# Patient Record
Sex: Female | Born: 1935 | Race: White | Hispanic: No | Smoking: Never smoker
Health system: Southern US, Community
[De-identification: ages and names within clinical notes are randomized; demographics above are authoritative.]

## PROBLEM LIST (undated history)

## (undated) DIAGNOSIS — K219 Gastro-esophageal reflux disease without esophagitis: Secondary | ICD-10-CM

## (undated) DIAGNOSIS — F0390 Unspecified dementia without behavioral disturbance: Secondary | ICD-10-CM

## (undated) DIAGNOSIS — I10 Essential (primary) hypertension: Secondary | ICD-10-CM

## (undated) DIAGNOSIS — F0280 Dementia in other diseases classified elsewhere without behavioral disturbance: Secondary | ICD-10-CM

## (undated) DIAGNOSIS — G309 Alzheimer's disease, unspecified: Secondary | ICD-10-CM

## (undated) DIAGNOSIS — M1990 Unspecified osteoarthritis, unspecified site: Secondary | ICD-10-CM

---

## 2002-05-09 ENCOUNTER — Encounter: Admission: RE | Admit: 2002-05-09 | Discharge: 2002-05-09 | Payer: Self-pay | Admitting: Orthopedic Surgery

## 2002-05-09 ENCOUNTER — Encounter: Payer: Self-pay | Admitting: Orthopedic Surgery

## 2002-05-25 ENCOUNTER — Encounter: Payer: Self-pay | Admitting: Neurosurgery

## 2002-05-25 ENCOUNTER — Inpatient Hospital Stay (HOSPITAL_COMMUNITY): Admission: RE | Admit: 2002-05-25 | Discharge: 2002-05-26 | Payer: Self-pay | Admitting: Neurosurgery

## 2002-07-06 ENCOUNTER — Encounter: Payer: Self-pay | Admitting: Neurosurgery

## 2002-07-06 ENCOUNTER — Encounter: Admission: RE | Admit: 2002-07-06 | Discharge: 2002-07-06 | Payer: Self-pay | Admitting: Neurosurgery

## 2002-10-25 ENCOUNTER — Encounter: Payer: Self-pay | Admitting: Orthopedic Surgery

## 2002-10-30 ENCOUNTER — Encounter: Payer: Self-pay | Admitting: Orthopedic Surgery

## 2002-10-30 ENCOUNTER — Inpatient Hospital Stay (HOSPITAL_COMMUNITY): Admission: RE | Admit: 2002-10-30 | Discharge: 2002-11-01 | Payer: Self-pay | Admitting: Orthopedic Surgery

## 2003-03-12 ENCOUNTER — Encounter: Admission: RE | Admit: 2003-03-12 | Discharge: 2003-03-12 | Payer: Self-pay | Admitting: Internal Medicine

## 2003-03-21 ENCOUNTER — Encounter: Admission: RE | Admit: 2003-03-21 | Discharge: 2003-03-21 | Payer: Self-pay | Admitting: Orthopedic Surgery

## 2003-10-04 ENCOUNTER — Emergency Department (HOSPITAL_COMMUNITY): Admission: EM | Admit: 2003-10-04 | Discharge: 2003-10-04 | Payer: Self-pay | Admitting: *Deleted

## 2004-05-06 ENCOUNTER — Ambulatory Visit: Payer: Self-pay | Admitting: Gastroenterology

## 2004-05-12 ENCOUNTER — Ambulatory Visit: Payer: Self-pay | Admitting: Gastroenterology

## 2009-10-28 ENCOUNTER — Encounter (HOSPITAL_BASED_OUTPATIENT_CLINIC_OR_DEPARTMENT_OTHER)
Admission: RE | Admit: 2009-10-28 | Discharge: 2009-12-26 | Payer: Self-pay | Source: Home / Self Care | Attending: General Surgery | Admitting: General Surgery

## 2009-11-01 ENCOUNTER — Inpatient Hospital Stay (HOSPITAL_COMMUNITY): Admission: EM | Admit: 2009-11-01 | Discharge: 2009-11-06 | Payer: Self-pay | Admitting: Emergency Medicine

## 2010-03-18 LAB — GLUCOSE, CAPILLARY
Glucose-Capillary: 108 mg/dL — ABNORMAL HIGH (ref 70–99)
Glucose-Capillary: 139 mg/dL — ABNORMAL HIGH (ref 70–99)
Glucose-Capillary: 155 mg/dL — ABNORMAL HIGH (ref 70–99)

## 2010-03-19 LAB — BASIC METABOLIC PANEL
BUN: 5 mg/dL — ABNORMAL LOW (ref 6–23)
BUN: 8 mg/dL (ref 6–23)
Calcium: 8.6 mg/dL (ref 8.4–10.5)
Chloride: 106 mEq/L (ref 96–112)
Creatinine, Ser: 0.92 mg/dL (ref 0.4–1.2)
GFR calc Af Amer: >60 mL/min (ref >60)
GFR calc non Af Amer: 58 mL/min — ABNORMAL LOW (ref >60)
Glucose, Bld: 108 mg/dL — ABNORMAL HIGH (ref 70–99)
Potassium: 3.9 mEq/L (ref 3.5–5.1)
Sodium: 141 mEq/L (ref 135–145)

## 2010-03-19 LAB — CBC
HCT: 37.9 % (ref 36.0–46.0)
MCH: 31.5 pg (ref 26.0–34.0)
MCHC: 33 g/dL (ref 30.0–36.0)
MCHC: 33.9 g/dL (ref 30.0–36.0)
MCV: 94.3 fL (ref 78.0–100.0)
Platelets: 231 10*3/uL (ref 150–400)
RBC: 4.02 MIL/uL (ref 3.87–5.11)
RDW: 13.1 % (ref 11.5–15.5)
RDW: 13.2 % (ref 11.5–15.5)
WBC: 7 10*3/uL (ref 4.0–10.5)

## 2010-03-19 LAB — COMPREHENSIVE METABOLIC PANEL
ALT: 16 U/L (ref 0–35)
Calcium: 8.4 mg/dL (ref 8.4–10.5)
Creatinine, Ser: 0.98 mg/dL (ref 0.4–1.2)
GFR calc Af Amer: >60 mL/min (ref >60)
GFR calc non Af Amer: 54 mL/min — ABNORMAL LOW (ref >60)
Glucose, Bld: 120 mg/dL — ABNORMAL HIGH (ref 70–99)
Sodium: 141 mEq/L (ref 135–145)
Total Protein: 5.5 g/dL — ABNORMAL LOW (ref 6.0–8.3)

## 2010-03-19 LAB — POCT CARDIAC MARKERS
Myoglobin, poc: 133 ng/mL (ref 12–200)
Troponin i, poc: <0.05 ng/mL (ref 0.00–0.09)

## 2010-03-19 LAB — GLUCOSE, CAPILLARY
Glucose-Capillary: 104 mg/dL — ABNORMAL HIGH (ref 70–99)
Glucose-Capillary: 111 mg/dL — ABNORMAL HIGH (ref 70–99)
Glucose-Capillary: 119 mg/dL — ABNORMAL HIGH (ref 70–99)
Glucose-Capillary: 126 mg/dL — ABNORMAL HIGH (ref 70–99)
Glucose-Capillary: 153 mg/dL — ABNORMAL HIGH (ref 70–99)
Glucose-Capillary: 169 mg/dL — ABNORMAL HIGH (ref 70–99)
Glucose-Capillary: 199 mg/dL — ABNORMAL HIGH (ref 70–99)
Glucose-Capillary: 216 mg/dL — ABNORMAL HIGH (ref 70–99)

## 2010-03-19 LAB — DIFFERENTIAL
Eosinophils Absolute: 0 10*3/uL (ref 0.0–0.7)
Eosinophils Relative: 1 % (ref 0–5)
Lymphocytes Relative: 10 % — ABNORMAL LOW (ref 12–46)
Lymphocytes Relative: 25 % (ref 12–46)
Lymphs Abs: 0.9 10*3/uL (ref 0.7–4.0)
Lymphs Abs: 1.8 10*3/uL (ref 0.7–4.0)
Monocytes Absolute: 0.6 10*3/uL (ref 0.1–1.0)
Monocytes Relative: 8 % (ref 3–12)
Monocytes Relative: 9 % (ref 3–12)
Neutrophils Relative %: 81 % — ABNORMAL HIGH (ref 43–77)

## 2010-03-19 LAB — CULTURE, BLOOD (ROUTINE X 2)
Culture  Setup Time: 201110290529
Culture: NO GROWTH

## 2010-03-19 LAB — URINALYSIS, ROUTINE W REFLEX MICROSCOPIC
Glucose, UA: NEGATIVE mg/dL
Ketones, ur: NEGATIVE mg/dL
Nitrite: NEGATIVE
Protein, ur: NEGATIVE mg/dL

## 2010-03-19 LAB — URINE CULTURE
Colony Count: >=100000
Culture  Setup Time: 201110282045

## 2010-03-19 LAB — URINE MICROSCOPIC-ADD ON

## 2010-03-22 ENCOUNTER — Emergency Department (HOSPITAL_COMMUNITY): Payer: PRIVATE HEALTH INSURANCE

## 2010-03-22 ENCOUNTER — Emergency Department (HOSPITAL_COMMUNITY)
Admission: EM | Admit: 2010-03-22 | Discharge: 2010-03-23 | Disposition: A | Discharge status: Home / Self Care | Payer: PRIVATE HEALTH INSURANCE | Attending: Emergency Medicine | Admitting: Emergency Medicine

## 2010-03-22 DIAGNOSIS — F0280 Dementia in other diseases classified elsewhere without behavioral disturbance: Secondary | ICD-10-CM | POA: Insufficient documentation

## 2010-03-22 DIAGNOSIS — W1809XA Striking against other object with subsequent fall, initial encounter: Secondary | ICD-10-CM | POA: Insufficient documentation

## 2010-03-22 DIAGNOSIS — Y92009 Unspecified place in unspecified non-institutional (private) residence as the place of occurrence of the external cause: Secondary | ICD-10-CM | POA: Insufficient documentation

## 2010-03-22 DIAGNOSIS — M810 Age-related osteoporosis without current pathological fracture: Secondary | ICD-10-CM | POA: Insufficient documentation

## 2010-03-22 DIAGNOSIS — G309 Alzheimer's disease, unspecified: Secondary | ICD-10-CM | POA: Insufficient documentation

## 2010-03-22 DIAGNOSIS — S0990XA Unspecified injury of head, initial encounter: Secondary | ICD-10-CM | POA: Insufficient documentation

## 2010-03-22 DIAGNOSIS — N949 Unspecified condition associated with female genital organs and menstrual cycle: Secondary | ICD-10-CM | POA: Insufficient documentation

## 2010-03-22 DIAGNOSIS — R51 Headache: Secondary | ICD-10-CM | POA: Insufficient documentation

## 2010-04-13 ENCOUNTER — Emergency Department (HOSPITAL_COMMUNITY)
Admission: EM | Admit: 2010-04-13 | Discharge: 2010-04-14 | Disposition: A | Discharge status: Home / Self Care | Payer: PRIVATE HEALTH INSURANCE | Attending: Emergency Medicine | Admitting: Emergency Medicine

## 2010-04-13 DIAGNOSIS — Z79899 Other long term (current) drug therapy: Secondary | ICD-10-CM | POA: Insufficient documentation

## 2010-04-13 DIAGNOSIS — R51 Headache: Secondary | ICD-10-CM | POA: Insufficient documentation

## 2010-04-13 DIAGNOSIS — M810 Age-related osteoporosis without current pathological fracture: Secondary | ICD-10-CM | POA: Insufficient documentation

## 2010-04-13 DIAGNOSIS — W1809XA Striking against other object with subsequent fall, initial encounter: Secondary | ICD-10-CM | POA: Insufficient documentation

## 2010-04-13 DIAGNOSIS — Y9210 Unspecified residential institution as the place of occurrence of the external cause: Secondary | ICD-10-CM | POA: Insufficient documentation

## 2010-04-13 DIAGNOSIS — F0280 Dementia in other diseases classified elsewhere without behavioral disturbance: Secondary | ICD-10-CM | POA: Insufficient documentation

## 2010-04-13 DIAGNOSIS — G309 Alzheimer's disease, unspecified: Secondary | ICD-10-CM | POA: Insufficient documentation

## 2010-04-13 DIAGNOSIS — S0100XA Unspecified open wound of scalp, initial encounter: Secondary | ICD-10-CM | POA: Insufficient documentation

## 2010-04-13 DIAGNOSIS — IMO0002 Reserved for concepts with insufficient information to code with codable children: Secondary | ICD-10-CM | POA: Insufficient documentation

## 2010-04-14 ENCOUNTER — Emergency Department (HOSPITAL_COMMUNITY): Payer: PRIVATE HEALTH INSURANCE

## 2010-04-14 LAB — URINALYSIS, ROUTINE W REFLEX MICROSCOPIC
Glucose, UA: NEGATIVE mg/dL
Ketones, ur: NEGATIVE mg/dL
Nitrite: POSITIVE — AB
Protein, ur: NEGATIVE mg/dL
Urobilinogen, UA: 0.2 mg/dL (ref 0.0–1.0)

## 2010-04-14 LAB — URINE MICROSCOPIC-ADD ON
Sperm, UA: NEGATIVE
Urine-Other: NEGATIVE

## 2010-05-13 ENCOUNTER — Emergency Department (HOSPITAL_COMMUNITY)
Admission: EM | Admit: 2010-05-13 | Discharge: 2010-05-13 | Disposition: A | Discharge status: Home / Self Care | Payer: Medicare Other | Attending: Emergency Medicine | Admitting: Emergency Medicine

## 2010-05-13 ENCOUNTER — Emergency Department (HOSPITAL_COMMUNITY): Payer: Medicare Other

## 2010-05-13 DIAGNOSIS — K219 Gastro-esophageal reflux disease without esophagitis: Secondary | ICD-10-CM | POA: Insufficient documentation

## 2010-05-13 DIAGNOSIS — R109 Unspecified abdominal pain: Secondary | ICD-10-CM | POA: Insufficient documentation

## 2010-05-13 DIAGNOSIS — F0280 Dementia in other diseases classified elsewhere without behavioral disturbance: Secondary | ICD-10-CM | POA: Insufficient documentation

## 2010-05-13 DIAGNOSIS — M810 Age-related osteoporosis without current pathological fracture: Secondary | ICD-10-CM | POA: Insufficient documentation

## 2010-05-13 DIAGNOSIS — Z79899 Other long term (current) drug therapy: Secondary | ICD-10-CM | POA: Insufficient documentation

## 2010-05-13 DIAGNOSIS — E119 Type 2 diabetes mellitus without complications: Secondary | ICD-10-CM | POA: Insufficient documentation

## 2010-05-13 DIAGNOSIS — G309 Alzheimer's disease, unspecified: Secondary | ICD-10-CM | POA: Insufficient documentation

## 2010-05-13 DIAGNOSIS — N390 Urinary tract infection, site not specified: Secondary | ICD-10-CM | POA: Insufficient documentation

## 2010-05-13 LAB — DIFFERENTIAL
Basophils Relative: 1 % (ref 0–1)
Monocytes Absolute: 0.8 10*3/uL (ref 0.1–1.0)
Monocytes Relative: 11 % (ref 3–12)
Neutro Abs: 5.1 10*3/uL (ref 1.7–7.7)

## 2010-05-13 LAB — COMPREHENSIVE METABOLIC PANEL
BUN: 11 mg/dL (ref 6–23)
CO2: 31 mEq/L (ref 19–32)
Calcium: 8.7 mg/dL (ref 8.4–10.5)
Creatinine, Ser: 0.72 mg/dL (ref 0.4–1.2)
GFR calc Af Amer: >60 mL/min (ref >60)
GFR calc non Af Amer: >60 mL/min (ref >60)
Glucose, Bld: 101 mg/dL — ABNORMAL HIGH (ref 70–99)

## 2010-05-13 LAB — URINALYSIS, ROUTINE W REFLEX MICROSCOPIC
Nitrite: NEGATIVE
Specific Gravity, Urine: 1.025 (ref 1.005–1.030)
Urobilinogen, UA: 0.2 mg/dL (ref 0.0–1.0)
pH: 6 (ref 5.0–8.0)

## 2010-05-13 LAB — CBC
HCT: 38.1 % (ref 36.0–46.0)
Hemoglobin: 12.3 g/dL (ref 12.0–15.0)
MCH: 31.3 pg (ref 26.0–34.0)
MCHC: 32.3 g/dL (ref 30.0–36.0)

## 2010-05-13 LAB — URINE MICROSCOPIC-ADD ON

## 2010-05-13 LAB — LIPASE, BLOOD: Lipase: 60 U/L — ABNORMAL HIGH (ref 11–59)

## 2010-05-15 LAB — URINE CULTURE: Culture  Setup Time: 201205081200

## 2010-05-18 ENCOUNTER — Emergency Department (HOSPITAL_COMMUNITY): Payer: PRIVATE HEALTH INSURANCE

## 2010-05-18 ENCOUNTER — Emergency Department (HOSPITAL_COMMUNITY)
Admission: EM | Admit: 2010-05-18 | Discharge: 2010-05-18 | Disposition: A | Discharge status: Home / Self Care | Payer: PRIVATE HEALTH INSURANCE | Attending: Emergency Medicine | Admitting: Emergency Medicine

## 2010-05-18 DIAGNOSIS — N390 Urinary tract infection, site not specified: Secondary | ICD-10-CM | POA: Insufficient documentation

## 2010-05-18 DIAGNOSIS — M1990 Unspecified osteoarthritis, unspecified site: Secondary | ICD-10-CM | POA: Insufficient documentation

## 2010-05-18 DIAGNOSIS — R51 Headache: Secondary | ICD-10-CM | POA: Insufficient documentation

## 2010-05-18 DIAGNOSIS — Z043 Encounter for examination and observation following other accident: Secondary | ICD-10-CM | POA: Insufficient documentation

## 2010-05-18 DIAGNOSIS — F0390 Unspecified dementia without behavioral disturbance: Secondary | ICD-10-CM | POA: Insufficient documentation

## 2010-05-18 DIAGNOSIS — Y9210 Unspecified residential institution as the place of occurrence of the external cause: Secondary | ICD-10-CM | POA: Insufficient documentation

## 2010-05-18 DIAGNOSIS — M810 Age-related osteoporosis without current pathological fracture: Secondary | ICD-10-CM | POA: Insufficient documentation

## 2010-05-18 DIAGNOSIS — W010XXA Fall on same level from slipping, tripping and stumbling without subsequent striking against object, initial encounter: Secondary | ICD-10-CM | POA: Insufficient documentation

## 2010-05-18 LAB — URINALYSIS, ROUTINE W REFLEX MICROSCOPIC
Bilirubin Urine: NEGATIVE
Nitrite: NEGATIVE
Specific Gravity, Urine: >1.03 — ABNORMAL HIGH (ref 1.005–1.030)
pH: 5.5 (ref 5.0–8.0)

## 2010-05-18 LAB — URINE MICROSCOPIC-ADD ON

## 2010-05-23 NOTE — H&P (Signed)
NAME:  [name], [name]                      ACCOUNT NO.:  [id number]   MEDICAL RECORD NO.:  [id number]                   PATIENT TYPE:  INP   LOCATION:  NA                                   FACILITY:  [facility]   PHYSICIAN:  Mila Homer. Sherlean Foot, M.D.              DATE OF BIRTH:  1921/06/01   DATE OF ADMISSION:  10/30/2002  DATE OF DISCHARGE:                                HISTORY & PHYSICAL   CHIEF COMPLAINT:  Right knee pain intermittently for the last 20 to 30  years.   HISTORY OF PRESENT ILLNESS:  This 77 year old white female patient presented  to Dr. Sherlean Foot with a history of 20 to 30 years of intermittent right knee  pain.  She reports about 57 years ago, during her first pregnancy, she  actually subluxed her patella on the right.  Multiple years later, she  suffered a right patella fracture.  Since that time, she has had on and off  intermittent problems with that right knee.  Over the last year or two,  however, she has had progressively worsening knee pain.  Did undergo a right  knee arthroscopy by Dr. Sherlean Foot in October 2003, and since that time, the pain  has been getting worse.   At this point, the pain in the right knee is a constant aching sensation,  mostly over the medial joint line diffusely about the joint without  radiation.  The pain increases with any standing or walking and then  decreases with sitting.  She is currently taking some Bextra for pain, and  that provides some relief.  She denies any popping, catching, grinding,  locking, or giving way.  No night pain in the knee.  The knee does swell a  minimal amount.  She is not ambulating with any assistive devices.  She has  had a series of Synvisc injections twice with the last one not being very  effective.   ALLERGIES:  1. FOSAMAX causes esophageal problems.  2. CELEBREX caused diarrhea.   CURRENT MEDICATIONS:  1. Bextra 20 mg 1 tablet p.o. daily, last dose October 22, 2002.  2. Mobic 7.5 mg 1 tablet p.o.  daily.  3. Tylenol 500 mg 2 tablets p.o. q.6h. p.r.n. for pain.  4. Ocuvite 1 tablet p.o. b.i.d.  5. Osteo-Bifllex 1 tablet p.o. t.i.d.  6. Neurontin 300 mg 1 tablet p.o. t.i.d.  7. Robaxin 500 mg 1 to 2 tablets p.o. q.6h. p.r.n. spasm.   PAST MEDICAL HISTORY:  1. Osteoarthritis.  2. Bursitis.  3. Macular degeneration.  4. Osteopenia.  5. Left shoulder pain x 4 or 5 months.   She denies any history of diabetes mellitus, hypertension, thyroid disease,  hiatal hernia, peptic ulcer disease, reflux, heart disease, asthma, or any  other chronic medical condition other than noted previously.   PAST SURGICAL HISTORY:  1. Bilateral tubal ligation and appendectomy.  2. Varicose vein stripping.  3. Hammertoe correction, right  third toe, and partial bunionectomy, right     foot.  4. Right knee arthroscopy in October 2003 by Mila Homer. Lucey, M.D.  5. Lumbar diskectomy and possible fusion by Dr. Phoebe Perch in 2004.  6. Tonsillectomy as a child.   She denies any complications from the above-mentioned procedures.   SOCIAL HISTORY:  She denies any history of cigarette smoking, alcohol use,  or drug use.  She is single and has three children.  She lives by herself in  a one-story house with four steps into the main entrance.  She is retired  from [employer].  Her medical doctor is Dr. Waynard Edwards at [phone].   FAMILY HISTORY:  Her mother died at the age of 44 with coronary artery  disease and osteoarthritis.  Her father died at the age of 27 with  pneumonia.  She has one brother alive at age 67 with osteoarthritis.  She  has three brothers who passed away, all in their 57s, and she does not know  much about their medical history.  She had three sisters who passed away,  two in their 60s or 61s and one at age 33.  The 75 year old had Hodgkin's  lymphoma, and she does not know what her older siblings had.  Her children  are age 70, 57, and 28, two sons and a daughter, and her daughter does  have  some heart problems.   REVIEW OF SYSTEMS:  She does wear dentures on both upper and lower jaw line.  She wears glasses.  She has decreased range of motion and strength in her  left foot due to her previous lumbar surgery.  She does complain of  occasional stiff and sore neck.  She has been having difficulty sleeping.  She has been having some problems with diarrhea, and she believes this is  associated with the BEXTRA, so she will be taken off that and try Mobic  prior to her surgery.  She complains of occasional urinary tract infection.  She is having some pain in her left big toe and possibly a bunion there. She  has had problems with her left shoulder on and off for the last four to five  months and wants to get that evaluated after her surgery.  She does have a  living will and does not have a power of attorney.  All other systems are  negative and noncontributory.   PHYSICAL EXAMINATION:  GENERAL:  Well-developed, well-nourished, overweight  white female in no acute distress.  Walks without a limp.  Mood and affect  are appropriate.  She talks easily with examiner.  VITAL SIGNS: Height 5 feet 2 inches, weight 174 pounds.  BMI is 31.  Temperature 96.3 degrees F, pulse 68, respirations 12, blood pressure  152/74.  HEENT:  Normocephalic and atraumatic without frontal or maxillary sinus  tenderness to palpation.  Conjunctivae pink, sclerae anicteric.  PERRLA.  EOMs intact.  No visible external ear deformities.  Hearing grossly intact.  Tympanic membranes pearly grey bilaterally with good light reflex.  Nose and  nasal septum midline.  Nasal mucosa pink and moist without exudate or polyps  noted.  Buccal mucosa pink and moist.  Good dentition.  Pharynx without  erythema or exudate.  Tongue and uvula midline.  Tongue without  fasciculations, and uvula rises equally with phonation. NECK:  No visible masses or lesions noted.  Trachea midline.  No palpable  lymphadenopathy nor  thyromegaly.  Carotids +2 bilaterally without bruits.  Full range of  motion and nontender to palpation along the cervical spine.  CARDIOVASCULAR:  Heart rate and rhythm regular.  S1, S2 present without  rubs, clicks, or murmurs noted.  RESPIRATORY:  Respirations even and unlabored.  Breath sounds clear to  auscultation bilaterally without rales or wheezes noted.  ABDOMEN:  Rounded abdominal contour. Bowel sounds presents x 4 quadrants.  Soft, nontender to palpation without hepatosplenomegaly or CVA tenderness.  EXTREMITIES:  Femoral pulses +2 bilaterally.  BACK:  Nontender to palpation along the entire length of the vertebral  column.  BREASTS/GU/RECTAL/PELVIC:  These exams deferred at this time.  MUSCULOSKELETAL:  No obvious deformities bilateral upper extremities with  full range of motion of the fingers, wrists, and elbows bilaterally.  She  has minimal crepitance with range of motion of both shoulders and has full  range of motion of that right shoulder.  She does have some mild pain with  palpation over the left AC joint and greater tuberosity. She has full  flexion and extension but does complain of pain with internal and external  rotation, and that seems to be slightly decreased when compared with the  right.  Impingement testing on the left shows she is a little bit weaker on  the left than the right.  She has full range of motion of her  hips, ankles,  and toes bilaterally.  DP and PT pulses +2.  No lower extremity edema.  Left  knee has full extension and flexion to 130 degrees with no crepitus.  There  is no effusion in the knee.  She has minimal tenderness to palpation over  the medial joint line, none laterally.  Stable to varus and valgus stress.  Negative anterior drawer.  No calf pain with palpation and negative Homans.  Right knee is lacking about 5 degrees of full extension and can flex to  about 120 degrees with minimal crepitance.  There is no effusion in the  knee.   She is tender to palpation over the medial joint line, none  laterally.  Stable to varus and valgus stress.  Negative anterior drawer.  No calf pain with palpation.  Negative Homan's sign.  NEUROLOGIC:  Alert and oriented x 3.  Cranial nerves II-XII grossly intact.  Strength 5/5 bilateral upper and lower extremities except left foot  dorsiflexion and plantar flexion about 4+/5.  Sensation intact to light  touch.  Deep tendon reflexes 2+ bilateral upper and lower extremities.  Rapid alternating movements are intact.   RADIOLOGIC FINDINGS:  X-rays taken of her right knee in September 2004  showed medial compartment osteophyte formation.  Arthroscopy done in 2003  showed one area in the superior pole of the patella that had some grade III  changes, otherwise patellofemoral compartment was well maintained.  Medial  compartment had grade IV changes as well as a bad posterior horn tear. There was a normal lateral compartment.   IMPRESSION:  1. Osteoarthritis of the right knee, medial compartment.  2. Macular degeneration.  3. Osteopenia.  4. Bursitis.   PLAN:  Ms. Tucholski will be admitted to [facility] on October 30, 2002, where she will undergo a right mini compartmental arthroplasty by Dr.  Mila Homer. Lucey.  She will undergo all the routine preoperative laboratory  tests and studies prior to this procedure.      Legrand Pitts Duffy, P.A.                      Mila Homer. Lucey,  M.D.    KED/MEDQ  D:  10/23/2002  T:  10/23/2002  Job:  [id number]

## 2010-05-23 NOTE — Op Note (Signed)
NAME:  [name], GASS NO.:  [id number]   MEDICAL RECORD NO.:  [id number]                   PATIENT TYPE:  INP   LOCATION:  5041                                 FACILITY:  [facility]   PHYSICIAN:  Mila Homer. Sherlean Foot, M.D.              DATE OF BIRTH:  07-22-1921   DATE OF PROCEDURE:  10/30/2002  DATE OF DISCHARGE:                                 OPERATIVE REPORT   SURGEON:  Mila Homer. Sherlean Foot, M.D.   ASSISTANT:  Richardean Canal, P.A.   ANESTHESIA:  General plus preoperative femoral nerve block.   PREOPERATIVE DIAGNOSIS:  Right knee osteoarthritis.   POSTOPERATIVE DIAGNOSIS:  Right knee osteoarthritis.   PROCEDURE:  Right knee unicompartmental arthroplasty.   INDICATIONS FOR PROCEDURE:  The patient is a 77 year old white female with  failure of conservative measures for osteoarthritis of the knee. Informed  consent was obtained.   DESCRIPTION OF PROCEDURE:  The patient was taken to the operating room and  administered general anesthesia. The right lower extremity was prepped and  draped in the usual sterile fashion.   A midline incision was made from the medial patella down to the medial  tibial tubercle approximately 5 cm in length. A new blade was used to make a  median peripatellar arthrotomy and to remove the fat pad on the medial side.  At this point a flap was created between  the fatty tissue  and the  retinaculum. At this point the retinaculum was teed and tagged to gain  exposure and also to act as retraction.   We then went into extension and put the tensioning device in the knee  tensioned, and under C-arm guidance, found the mechanical axis. We then  pinned the femoral cutting block into place and made the femoral cut with a  sagittal saw. We then removed the extramedullary alignment system on the  correction for the tibial block on the same 2 pins and made our tibial cut  with the sagittal and reciprocating saws. We then sized the femur to  a small  plus, drilled our lug holes and cut our posterior cut and chamfer.   We then turned our attention to the tibia, where it was sized to a size 2. I  then placed a finishing block and drilled the lugs. We then trialed with a  small plus femur, size 2 tibia, size 8 insert, size 10 insert  sequentially  and felt that the 8 had better  balance; the 10 was too tight.   I then removed the trial components and copiously irrigated the knee. At  this point I cemented in the femur first and tibia 2nd and snapped in the  real polyethylene after removing all excess cement. I then closed the  arthrotomy with  interrupted #1 figure-of-8 Vicryl sutures, the deep soft tissues with  interrupted 0 Vicryl sutures and the subcuticular with 2-0 Vicryl. I dressed  with a Xeroform  dressing, sponges, sterile Webril and a TED stocking.   Tourniquet time was 1 hour. There were no complications. There were no  drains.                                               Mila Homer. Sherlean Foot, M.D.    SDL/MEDQ  D:  10/30/2002  T:  10/30/2002  Job:  [id number]

## 2010-05-23 NOTE — Op Note (Signed)
NAME:  KYNDEL, EGGER NO.:  [id number]   MEDICAL RECORD NO.:  [id number]                   PATIENT TYPE:  INP   LOCATION:  2853                                 FACILITY:  [facility]   PHYSICIAN:  Clydene Fake, M.D.               DATE OF BIRTH:  03-May-1921   DATE OF PROCEDURE:  05/25/2002  DATE OF DISCHARGE:                                 OPERATIVE REPORT   PREOPERATIVE DIAGNOSIS:  Left far lateral herniated nucleus pulposus, L4-5.   POSTOPERATIVE DIAGNOSIS:  Left far lateral herniated nucleus pulposus, L4-5.   OPERATION PERFORMED:  Left L4-5 extraforaminal far lateral diskectomy,  microdissection with microscope.   SURGEON:  Clydene Fake, M.D.   ASSISTANT:  Hewitt Shorts, M.D.   ANESTHESIA:  General endotracheal.   ESTIMATED BLOOD LOSS:  Minimal.   BLOOD REPLACED:  None.   DRAINS:  None.   COMPLICATIONS:  None.   INDICATIONS FOR PROCEDURE:  The patient is a 77 year old woman who has had a  month of left leg pain radiating down the anterior shin with some numbness.  She had some hip injections which did not help, states past bursitis  problems with her hips before.  MRI was done showing some lumbar stenosis at  4-5 on the left.  A very large far lateral disk herniation compressing the  L4 root.  Patient brought in for diskectomy.   DESCRIPTION OF PROCEDURE:  The patient was brought to the operating room and  general anesthesia induced.  The patient was placed in prone position in the  Wilson frame with all pressure points padded.  The patient was prepped and  draped in sterile fashion. Needle was placed at interspace.  X-rays obtained  showing this was the 3-4 interspace.  Incision was then made in the midline  in the lower lumbar spine centered just below where the needle was placed.  Incision was taken down to the fascia and hemostasis obtained with Bovie  cauterization.  Dissected out laterally couple of centimeters and the  fascia  was incised on the side paracentrally.  Blunt dissection was taken through  the muscle plane down to the facet joint.  The self-retaining retractor was  placed.  We dissected out facets at 4-5 and 3-4 and the transverse processes  of 4 and 5.  L4 and 5 were then dissected out and __________ was also  dissected out.  A marker was placed at the interspace.  X-ray was obtained  confirming we were at the 4-5 interspace.  Microscope was brought in for  microdissection at this point.  We carefully drilled the lateral facet,  lateral pars the 4-5 disk space.  We dissected down between the transverse  processes down to pedicle and moved upwards and found the disk space.  We  found the nerve root fairly quickly and it was tented up and pushed  posteriorly towards [place].  We dissected it  from the large disk herniation.  The disk space was __________ and diskectomy was performed and then we were  able to return the nerve root to pull out large pieces of free fragments  that were wrapped around the root.  Removed some osteophytes of the 4-5  interspace and continued to perform diskectomy in the interspace with  pituitary rongeurs and curets.  When we were finished, we had very good  decompression of the foramen and of the 4 nerve root coming out and going  back medially.  Hemostasis obtained with Gelfoam and Thrombin.  Gelfoam was  irrigated out.  The wound was irrigated with antibiotic solution and two  pieces of Gelfoam soaked with Depo-Medrol and this was placed over the 4  root.  Retractors were remove.  Fascia closed with 0 Vicryl interrupted  suture.  The subcutaneous tissue was closed with 2-0 and 3-0 Vicryl  interrupted suture and the skin closed with Durabond skin glue.  A dry  dressing was placed.  The patient was placed back in supine position,  awakened from anesthesia and returned to the recovery room stable condition.                                                Clydene Fake, M.D.    JRH/MEDQ  D:  05/25/2002  T:  05/25/2002  Job:  [id number]

## 2010-05-23 NOTE — Discharge Summary (Signed)
NAME:  [name], [name] NO.:  [id number]   MEDICAL RECORD NO.:  [id number]                   PATIENT TYPE:  INP   LOCATION:  5041                                 FACILITY:  [facility]   PHYSICIAN:  Mila Homer. Sherlean Foot, M.D.              DATE OF BIRTH:  Feb 19, 1921   DATE OF ADMISSION:  10/30/2002  DATE OF DISCHARGE:  11/01/2002                                 DISCHARGE SUMMARY   ADMITTING DIAGNOSES:  1. Severe osteoarthritis, the medial compartment.  2. Osteoarthritis.  3. History of bursitis.  4. History of macular degeneration.  5. History of osteopenia.   DISCHARGE DIAGNOSES:  1. A right knee medial compartment with a unicompartmental arthroplasty.  2. History of osteoarthritis.  3. History of macular degeneration.  4. History of osteopenia.   SURGEON:  Mila Homer. Sherlean Foot, M.D.   ASSISTANT:  Richardean Canal, P.A.   ANESTHESIA:  General.   SURGICAL PROCEDURE:  On October 30, 2002, the patient was taken to the OR by  Dr. Mila Homer. Sherlean Foot, M.D., assisted by Richardean Canal, P.A. under general  anesthesia.  The patient underwent a right knee medial compartment  unicompartmental arthroplasty.  The patient tolerated the procedure well,  received a femoral nerve block and was transferred to the recovery room  without any complications.   CONSULTS:  The following routine consults were requested:  1. Physical therapy.  2. Occupational therapy.   HOSPITAL COURSE:  On October 30, 2002, patient was admitted to [facility] in the care of Dr. Georgena Spurling, M.D.  The patient was taken to  the OR where a right knee medial compartment unicompartmental arthroplasty  was performed.  The patient tolerated the procedure well.  There were no  complications.  The patient has a postoperative femoral nerve block placed,  and she was transferred to the recovery room, then to the orthopedic floor  for routine post-op care.  The patient was placed on Lovenox for  routine DVT  prophylaxis.  The patient was in the hospital for a total of two days  postoperative care during which there were no significant events.  The  patient's vital signs remained stable.  Her leg remained neuromotor  vascularly intact.  Upon date of discharge, the patient did have a little  bit of erythema about the anterior part of the knee around the incision; so,  she was placed on Keflex for a total of seven days preventative treatment.  The patient's wound was otherwise intact, was neuromotor vascularly intact,  and she worked very well with physical therapy.   On the preoperative chest x-ray there was an ovoid nodule noted on the left  posterior lung base.  This was evaluated with a chest CT, while she was in  the hospital, that was read, by the radiologist, as being an elevation of  the diaphragm muscle mass with some fatty tissue.  No further evaluation was  needed.   The patient was discharged to home in good health with routine home health  outpatient physical therapy protocol.   LABS:  Chest x-ray, on admission, showed ovoid mass left posterior base.  This was described on chest x-ray on May 2004, was subsequently followed up  on this hospitalization with a chest CT and this was found to be an  elevation of the muscle mass and fatty tissue with no further  recommendations for followup per the radiologist.   EKG, on admission, showed sinus bradycardia at 58 beats per minute.   CBC, on October 31, 2002, WBCs 8.0, hemoglobin 12.2, hematocrit 35.4,  platelets 214.  Routine chemistries, on October 31, 2002, sodium 138,  potassium 3.5, glucose 119 thought to be elevated due to surgical stress,  BUN 6, creatinine 0.7.  Routine urinalysis, on admission, was normal.  Blood  type was O positive and negative antibody screening.   MEDICATIONS UPON DISCHARGE FROM ORTHOPEDIC FLOOR:  1. Colace 100 mg p.o. b.i.d.  2. Trinsicon one tablet p.o. t.i.d.  3. Bextra 20 mg p.o. every  day.  4. Neurontin 300 mg p.o. t.i.d.  5. Ocuvite one tablet p.o. b.i.d.  6. Benadryl 25 mg p.o. q.6h. p.r.n.  7. Laxative or enema of choice p.r.n.  8. Percocet 1-2 tablets every four to six hours p.r.n.  9. Tylenol 650 mg p.o. q.4h. p.r.n.  10.      Robaxin 500 mg p.o. q.8h. p.r.n.  11.      Lovenox 30 mg subcu q.12h. to be changed to 40 mg p.o. every day on     discharge.   DISCHARGE INSTRUCTIONS:   MEDICATIONS:  1. The patient is to resume routine home medicines.  2. Keflex 500 mg one tablet four times a day for seven days.  3. Percocet 5 mg, 1-2 tablets every four to six hours for pain if needed.  4. Lovenox 40 mg injection once a day for the next 12 days.   PAIN MANAGEMENT:  Use Percocet as indicated above.   ACTIVITY:  As tolerated with use of a walker.   DIET:  No restrictions.   WOUND CARE:  Keep wound clean.  If any signs of worsening of the infection,  the patient is to call Dr. Tobin Chad office for advice.    FOLLOWUP:  The patient is to call [phone] for a followup appointment with  Dr. Sherlean Foot in the next two weeks.   CONDITION ON DISCHARGE:  Patient's condition upon discharge to home is  listed as improved and good.      Jamelle Rushing, P.A.                      Mila Homer. Sherlean Foot, M.D.    RWK/MEDQ  D:  11/01/2002  T:  11/01/2002  Job:  [id number]   cc:   Loraine Leriche A. Waynard Edwards, M.D.  [address]  [place]  [place] [address]  Fax: [phone]

## 2010-08-02 ENCOUNTER — Encounter: Payer: Self-pay | Admitting: *Deleted

## 2010-08-02 ENCOUNTER — Emergency Department (HOSPITAL_COMMUNITY)
Admission: EM | Admit: 2010-08-02 | Discharge: 2010-08-02 | Disposition: A | Discharge status: Skilled Nursing Facility | Payer: PRIVATE HEALTH INSURANCE | Attending: Emergency Medicine | Admitting: Emergency Medicine

## 2010-08-02 DIAGNOSIS — K921 Melena: Secondary | ICD-10-CM | POA: Insufficient documentation

## 2010-08-02 HISTORY — DX: Unspecified osteoarthritis, unspecified site: M19.90

## 2010-08-02 HISTORY — DX: Dementia in other diseases classified elsewhere, unspecified severity, without behavioral disturbance, psychotic disturbance, mood disturbance, and anxiety: F02.80

## 2010-08-02 HISTORY — DX: Gastro-esophageal reflux disease without esophagitis: K21.9

## 2010-08-02 HISTORY — DX: Essential (primary) hypertension: I10

## 2010-08-02 HISTORY — DX: Alzheimer's disease, unspecified: G30.9

## 2010-08-02 LAB — COMPREHENSIVE METABOLIC PANEL
ALT: 14 U/L (ref 0–35)
AST: 16 U/L (ref 0–37)
Albumin: 3.1 g/dL — ABNORMAL LOW (ref 3.5–5.2)
Alkaline Phosphatase: 75 U/L (ref 39–117)
Calcium: 9 mg/dL (ref 8.4–10.5)
GFR calc Af Amer: >60 mL/min (ref >60)
Glucose, Bld: 95 mg/dL (ref 70–99)
Potassium: 3.5 mEq/L (ref 3.5–5.1)
Sodium: 144 mEq/L (ref 135–145)
Total Protein: 5.7 g/dL — ABNORMAL LOW (ref 6.0–8.3)

## 2010-08-02 LAB — CBC
MCH: 32 pg (ref 26.0–34.0)
Platelets: 208 10*3/uL (ref 150–400)
RBC: 3.91 MIL/uL (ref 3.87–5.11)
RDW: 13.7 % (ref 11.5–15.5)
WBC: 5.4 10*3/uL (ref 4.0–10.5)

## 2010-08-02 LAB — DIFFERENTIAL
Basophils Absolute: 0.1 10*3/uL (ref 0.0–0.1)
Eosinophils Absolute: 0.2 10*3/uL (ref 0.0–0.7)
Lymphs Abs: 1.8 10*3/uL (ref 0.7–4.0)
Neutrophils Relative %: 49 % (ref 43–77)

## 2010-08-02 NOTE — ED Notes (Signed)
Rectal exam done, red blood with mucus present.

## 2010-08-02 NOTE — ED Provider Notes (Signed)
History     Chief Complaint  Patient presents with  . GI Bleeding   Patient is a 77 y.o. female presenting with hematochezia. The history is provided by the nursing home ([facility] is resident of [facility]). History Limited By: dementia.  Rectal Bleeding  The current episode started 2 days ago. The problem occurs occasionally. The problem has been gradually worsening. The stool is described as soft. Associated symptoms comments: Excoriated perianal area. External hemorrhoids without inflammation.. There were no sick contacts.    Past Medical History  Diagnosis Date  . Arthritis   . GE reflux   . Alzheimer's disease     History reviewed. No pertinent past surgical history.  History reviewed. No pertinent family history.  History  Substance Use Topics  . Smoking status: Not on file  . Smokeless tobacco: Not on file  . Alcohol Use: No    OB History    Grav Para Term Preterm Abortions TAB SAB Ect Mult Living                  Review of Systems  Unable to perform ROS Gastrointestinal: Positive for hematochezia.    Physical Exam  BP 155/67  Pulse 97  Temp(Src) 98.1 F (36.7 C) (Oral)  Resp 14  SpO2 97%  Physical Exam  Constitutional:       Elderly woman in no acute distress.  HENT:  Head: Normocephalic and atraumatic.  Eyes: EOM are normal. Pupils are equal, round, and reactive to light.  Neck: Normal range of motion. Neck supple. No JVD present. No thyromegaly present.  Cardiovascular: Normal rate and normal heart sounds.   Pulmonary/Chest: Effort normal.  Abdominal: Soft. Bowel sounds are normal. She exhibits no distension and no mass. There is no tenderness. There is no rebound and no guarding.       stoll in rectal vault with bloody mucus. Blood around the anus. Perianal area with irritation  Lymphadenopathy:    She has no cervical adenopathy.  Neurological: She is alert.       Oriented to person  Skin: Skin is warm and dry.    ED Course    Procedures  MDM Patient from nursing home with reported blood in the stool. She is currently on metronidazole for a supposed mild colitis. She is also on doxycycline for prevention/treatment of MRSA associated with an injury to the lower right leg. Labs reviewed show a normal hgb, hct. Watchful waiting is appropriate at this time pending completion of her antibiotic therapies.. Follow up with GI as the discretion of her doctor.      Nicoletta Dress. Colon Branch, MD 08/02/10 [phone]

## 2010-08-02 NOTE — ED Notes (Signed)
Awake alert,  Knows she is in hospital, but unable to state year or month.  Skin warm and dry.  Rt lower leg looks red and warm.  Reported to have had rectal bleed tonight , similar episode on Thursday. Pt is from  [facility].

## 2010-08-02 NOTE — Discharge Instructions (Signed)
Have the patient finish her antibiotics then re-evaluate rectal bleeding. She can follow up with GI.Bloody Stools Blood in the poop (bloody stools) is a sign that there is a problem somewhere in the digestive system. The blood might be black, medium red, or bright red. You might also have pain, weakness, throwing up (vomiting), or fever. There are many things that can cause blood in the poop. It is important to figure out what is causing the bleeding. Your doctor might do some tests to figure out what is wrong. When your doctor knows why the bleeding is happening, you will get help to stop the bleeding. HOME CARE  Take medicines, as told by your doctor.   Eat food with lots of fiber (like prunes and bran cereals) and drink lots of liquids.   Sit in a bathtub with warm water for 10-15 minutes, to soak and clean the area, as told by your doctor.   Watch for signs that you are getting better or getting worse.  GET HELP RIGHT AWAY IF:  Your bleeding continues or gets worse.   You throw up (vomit) blood.   You feel weak or dizzy.   You have a temperature by mouth above101, not controlled by medicine.   You have strong pain.  MAKE SURE YOU:   Understand these instructions.   Will watch your condition.   Will get help right away if you are not doing well or get worse.  Document Released: 12/10/2008 Document Re-Released: 01/13/2009 [facility] Patient Information 2011 [place], [place].

## 2010-10-01 ENCOUNTER — Emergency Department (HOSPITAL_COMMUNITY): Payer: PRIVATE HEALTH INSURANCE

## 2010-10-01 ENCOUNTER — Emergency Department (HOSPITAL_COMMUNITY)
Admission: EM | Admit: 2010-10-01 | Discharge: 2010-10-01 | Disposition: A | Discharge status: Home / Self Care | Payer: PRIVATE HEALTH INSURANCE | Attending: Emergency Medicine | Admitting: Emergency Medicine

## 2010-10-01 ENCOUNTER — Encounter (HOSPITAL_COMMUNITY): Payer: Self-pay | Admitting: *Deleted

## 2010-10-01 DIAGNOSIS — E119 Type 2 diabetes mellitus without complications: Secondary | ICD-10-CM | POA: Insufficient documentation

## 2010-10-01 DIAGNOSIS — M129 Arthropathy, unspecified: Secondary | ICD-10-CM | POA: Insufficient documentation

## 2010-10-01 DIAGNOSIS — S161XXA Strain of muscle, fascia and tendon at neck level, initial encounter: Secondary | ICD-10-CM

## 2010-10-01 DIAGNOSIS — G309 Alzheimer's disease, unspecified: Secondary | ICD-10-CM | POA: Insufficient documentation

## 2010-10-01 DIAGNOSIS — I10 Essential (primary) hypertension: Secondary | ICD-10-CM | POA: Insufficient documentation

## 2010-10-01 DIAGNOSIS — R51 Headache: Secondary | ICD-10-CM | POA: Insufficient documentation

## 2010-10-01 DIAGNOSIS — S0100XA Unspecified open wound of scalp, initial encounter: Secondary | ICD-10-CM | POA: Insufficient documentation

## 2010-10-01 DIAGNOSIS — S139XXA Sprain of joints and ligaments of unspecified parts of neck, initial encounter: Secondary | ICD-10-CM | POA: Insufficient documentation

## 2010-10-01 DIAGNOSIS — K219 Gastro-esophageal reflux disease without esophagitis: Secondary | ICD-10-CM | POA: Insufficient documentation

## 2010-10-01 DIAGNOSIS — F0280 Dementia in other diseases classified elsewhere without behavioral disturbance: Secondary | ICD-10-CM | POA: Insufficient documentation

## 2010-10-01 DIAGNOSIS — W19XXXA Unspecified fall, initial encounter: Secondary | ICD-10-CM

## 2010-10-01 DIAGNOSIS — S0101XA Laceration without foreign body of scalp, initial encounter: Secondary | ICD-10-CM

## 2010-10-01 DIAGNOSIS — W1789XA Other fall from one level to another, initial encounter: Secondary | ICD-10-CM | POA: Insufficient documentation

## 2010-10-01 MED ORDER — LIDOCAINE-EPINEPHRINE (PF) 1 %-1:200000 IJ SOLN
INTRAMUSCULAR | Status: AC
  Administered 2010-10-01: 15:00:00
  Filled 2010-10-01: qty 10

## 2010-10-01 NOTE — ED Provider Notes (Signed)
History     CSN: [id number] Arrival date & time: 10/01/2010  1:16 PM  Chief Complaint  Patient presents with  . Fall  . Head Laceration    (Consider location/radiation/quality/duration/timing/severity/associated sxs/prior treatment) HPI Comments: Denies neck pain.    Patient is a 77 y.o. female presenting with fall and scalp laceration. The history is provided by the patient and the nursing home.  Fall The accident occurred 1 to 2 hours ago. The fall occurred while walking. She landed on a hard floor. The point of impact was the head. The pain is present in the head. The pain is at a severity of 0/10. The patient is experiencing no pain. She was ambulatory at the scene. There was no drug use involved in the accident. There was no alcohol use involved in the accident. Pertinent negatives include no headaches.  Head Laceration Pertinent negatives include no chest pain, no headaches and no shortness of breath.    Past Medical History  Diagnosis Date  . Arthritis   . GE reflux   . Alzheimer's disease   . Diabetes mellitus   . Hypertension     History reviewed. No pertinent past surgical history.  No family history on file.  History  Substance Use Topics  . Smoking status: Not on file  . Smokeless tobacco: Not on file  . Alcohol Use: No    OB History    Grav Para Term Preterm Abortions TAB SAB Ect Mult Living                  Review of Systems  Constitutional: Negative for activity change.  HENT: Negative for neck pain and neck stiffness.   Eyes: Negative for visual disturbance.  Respiratory: Negative for shortness of breath.   Cardiovascular: Negative for chest pain.  Neurological: Negative for dizziness, light-headedness and headaches.  All other systems reviewed and are negative.    Allergies  Penicillins  Home Medications   Current Outpatient Rx  Name Route Sig Dispense Refill  . ALENDRONATE SODIUM 70 MG PO TABS Oral Take 70 mg by mouth every 7 (seven)  days. Take with a full glass of water on an empty stomach.     . OS-CAL 500 + D PO Oral Take 500 mg by mouth.      . CEPHALEXIN 500 MG PO CAPS Oral Take 500 mg by mouth 2 (two) times daily. Take for 7 days started 09-24-10     . DONEPEZIL HCL 10 MG PO TABS Oral Take 10 mg by mouth at bedtime as needed.      Marland Kitchen VITAMIN D2 PO Oral Take 1.25 mg by mouth.      Marland Kitchen BACID PO TABS Oral Take 1 tablet by mouth daily.      Marland Kitchen LISINOPRIL 30 MG PO TABS Oral Take 30 mg by mouth daily.      Marland Kitchen MEMANTINE HCL 10 MG PO TABS Oral Take 10 mg by mouth 2 (two) times daily.      Marland Kitchen METOPROLOL SUCCINATE 50 MG PO TB24 Oral Take 50 mg by mouth daily.      Marland Kitchen CARNATION INST BREAKFAST JUICE PO Oral Take 1 Can by mouth 3 (three) times daily.      Marland Kitchen PAROXETINE HCL 40 MG PO TABS Oral Take 40 mg by mouth every morning.      Marland Kitchen QUETIAPINE FUMARATE 50 MG PO TABS Oral Take 50 mg by mouth at bedtime.      Marland Kitchen DOXYCYCLINE HYCLATE 100 MG PO  CAPS Oral Take 100 mg by mouth 2 (two) times daily.      Marland Kitchen LORAZEPAM 0.5 MG PO TABS Oral Take 0.5 mg by mouth every 8 (eight) hours as needed.      Marland Kitchen MAGNESIUM HYDROXIDE 400 MG/5ML PO SUSP Oral Take by mouth daily as needed.      Marland Kitchen ZINC OXIDE 20 % EX OINT Topical Apply 1 application topically as needed.      Marland Kitchen ZOLPIDEM TARTRATE 12.5 MG PO TBCR Oral Take 5 mg by mouth at bedtime as needed.        BP 133/62  Pulse 54  Temp(Src) 97.9 F (36.6 C) (Oral)  Resp 18  Ht 5\' 4"  (1.626 m)  Wt 160 lb (72.576 kg)  BMI 27.46 kg/m2  SpO2 97%  Physical Exam  Constitutional: She appears well-developed and well-nourished. No distress.  HENT:  Head: Normocephalic.  Right Ear: External ear normal.  Left Ear: External ear normal.       There is a 1.5 cm laceration to the occiput.    Eyes: EOM are normal. Pupils are equal, round, and reactive to light.  Neck: Normal range of motion. Neck supple.  Cardiovascular: Normal rate, regular rhythm and normal heart sounds.  Exam reveals no gallop and no friction rub.     No murmur heard. Pulmonary/Chest: Effort normal and breath sounds normal.  Neurological: She is alert. No cranial nerve deficit. Coordination normal.       Patient has history of dementia and disorientation, but responds appropriately to questions.  Skin: She is not diaphoretic.    ED Course  LACERATION REPAIR Performed by: Geoffery Lyons Authorized by: Geoffery Lyons Consent: Verbal consent obtained. Consent given by: patient Patient understanding: patient states understanding of the procedure being performed Patient consent: the patient's understanding of the procedure matches consent given Procedure consent: procedure consent matches procedure scheduled Relevant documents: relevant documents present and verified Test results: test results available and properly labeled Site marked: the operative site was marked Imaging studies: imaging studies available Required items: required blood products, implants, devices, and special equipment available Patient identity confirmed: verbally with patient Time out: Immediately prior to procedure a "time out" was called to verify the correct patient, procedure, equipment, support staff and site/side marked as required. Body area: head/neck Location details: scalp Laceration length: 1.5 cm Foreign bodies: no foreign bodies Vascular damage: no Anesthesia: local infiltration Local anesthetic: lidocaine 1% with epinephrine Anesthetic total: 2 ml Patient sedated: no Preparation: Patient was prepped and draped in the usual sterile fashion. Amount of cleaning: standard Debridement: none Degree of undermining: none Skin closure: staples Number of sutures: 2 Approximation: close Approximation difficulty: simple Dressing: antibiotic ointment Patient tolerance: Patient tolerated the procedure well with no immediate complications.   (including critical care time)  Labs Reviewed - No data to display No results found.   No diagnosis  found.    MDM  Ct's okay.  Will discharge to home.        Geoffery Lyons, MD 10/01/10 [phone]

## 2010-10-01 NOTE — ED Notes (Signed)
Pt would not stay in bed, put pt in chair, she seem to be doing better in the chair

## 2010-10-01 NOTE — ED Notes (Signed)
Resident of [facility]: was walking in socks and slipped and fell on floor; no LOC per [facility] staff; laceration to back of head

## 2010-10-01 NOTE — ED Notes (Signed)
Wound to back of head cleansed to reveal approx 1.5 cm scalp laceration with minimal bleeding

## 2010-10-10 ENCOUNTER — Encounter (HOSPITAL_COMMUNITY): Payer: Self-pay | Admitting: *Deleted

## 2010-10-10 ENCOUNTER — Emergency Department (HOSPITAL_COMMUNITY)
Admission: EM | Admit: 2010-10-10 | Discharge: 2010-10-10 | Disposition: A | Discharge status: Home / Self Care | Payer: PRIVATE HEALTH INSURANCE | Attending: Emergency Medicine | Admitting: Emergency Medicine

## 2010-10-10 DIAGNOSIS — Z79899 Other long term (current) drug therapy: Secondary | ICD-10-CM | POA: Insufficient documentation

## 2010-10-10 DIAGNOSIS — Z4802 Encounter for removal of sutures: Secondary | ICD-10-CM

## 2010-10-10 MED ORDER — BACITRACIN-NEOMYCIN-POLYMYXIN 400-5-5000 EX OINT
TOPICAL_OINTMENT | Freq: Once | CUTANEOUS | Status: AC
  Administered 2010-10-10: 1 via TOPICAL
  Filled 2010-10-10: qty 1

## 2010-10-10 MED ORDER — LIDOCAINE HCL (PF) 1 % IJ SOLN
5.0000 mL | Freq: Once | INTRAMUSCULAR | Status: AC
  Administered 2010-10-10: 5 mL
  Filled 2010-10-10: qty 5

## 2010-10-10 NOTE — ED Notes (Signed)
Suture removed by Tammy Triplett.

## 2010-10-10 NOTE — ED Notes (Signed)
Attempted to remove staple from scalp. Unable to get remover under staple. Pt c/o pain with attempt of staple removal. No bleeding noted.

## 2010-10-10 NOTE — ED Notes (Signed)
Pt a/ox2. Resp even and unlabored. NAD at this time. D/C instructions reviewed with care taker from SNF. Care taker verbalized understanding. Pt ambulated with steady gate to transport vehicle.

## 2010-10-10 NOTE — ED Provider Notes (Signed)
History     CSN: [id number] Arrival date & time: 10/10/2010  5:19 PM  Chief Complaint  Patient presents with  . Suture / Staple Removal    (Consider location/radiation/quality/duration/timing/severity/associated sxs/prior treatment) HPI Comments: Patient was seen here for a laceration to her scalp 8 days ago and wound was closed with staples.  The staples were removed today by one of the patient's home health nurses but she was unable to remove one of the staples.    Patient is a 77 y.o. female presenting with suture removal. The history is provided by the patient and a caregiver.  Suture / Staple Removal  The sutures were placed 7 to 10 days ago. There has been no treatment since the wound repair. Fever duration: no fever. There has been no drainage from the wound. There is no redness present. There is no swelling present. The pain has no pain. She has no difficulty moving the affected extremity or digit.    Past Medical History  Diagnosis Date  . Arthritis   . GE reflux   . Alzheimer's disease   . Diabetes mellitus   . Hypertension     History reviewed. No pertinent past surgical history.  History reviewed. No pertinent family history.  History  Substance Use Topics  . Smoking status: Not on file  . Smokeless tobacco: Not on file  . Alcohol Use: No    OB History    Grav Para Term Preterm Abortions TAB SAB Ect Mult Living                  Review of Systems  Constitutional: Negative for fever, chills and fatigue.  HENT: Negative for sore throat, trouble swallowing, neck pain and neck stiffness.   Musculoskeletal: Negative for myalgias, back pain and arthralgias.  Skin: Negative for color change and rash.  Neurological: Negative for dizziness, weakness and numbness.  Hematological: Negative for adenopathy. Does not bruise/bleed easily.    Allergies  Penicillins  Home Medications   Current Outpatient Rx  Name Route Sig Dispense Refill  . ALENDRONATE SODIUM 70  MG PO TABS Oral Take 70 mg by mouth every 7 (seven) days. Take with a full glass of water on an empty stomach.     . OS-CAL 500 + D PO Oral Take 500 mg by mouth 2 (two) times daily.     . DONEPEZIL HCL 10 MG PO TABS Oral Take 10 mg by mouth at bedtime as needed.      Marland Kitchen VITAMIN D2 PO Oral Take 1.25 mg by mouth once a week. On Fridays of each week    . LISINOPRIL 30 MG PO TABS Oral Take 30 mg by mouth daily.      Marland Kitchen MEMANTINE HCL 10 MG PO TABS Oral Take 10 mg by mouth 2 (two) times daily.      Marland Kitchen METOPROLOL SUCCINATE 50 MG PO TB24 Oral Take 50 mg by mouth daily.      Marland Kitchen CARNATION INST BREAKFAST JUICE PO Oral Take 1 Can by mouth 3 (three) times daily.      Marland Kitchen PAROXETINE HCL 40 MG PO TABS Oral Take 40 mg by mouth every morning.      Marland Kitchen RISA-BID PROBIOTIC PO Oral Take 1 tablet by mouth daily.      . QUETIAPINE FUMARATE 50 MG PO TABS Oral Take 50 mg by mouth at bedtime.      . CEPHALEXIN 500 MG PO CAPS Oral Take 500 mg by mouth 2 (two) times  daily. Take for 7 days started 09-24-10     . DOXYCYCLINE HYCLATE 100 MG PO CAPS Oral Take 100 mg by mouth 2 (two) times daily.      Marland Kitchen BACID PO TABS Oral Take 1 tablet by mouth daily.      Marland Kitchen LORAZEPAM 0.5 MG PO TABS Oral Take 0.5 mg by mouth every 8 (eight) hours as needed.      Marland Kitchen MAGNESIUM HYDROXIDE 400 MG/5ML PO SUSP Oral Take by mouth daily as needed.      Marland Kitchen ZINC OXIDE 20 % EX OINT Topical Apply 1 application topically as needed.      Marland Kitchen ZOLPIDEM TARTRATE 12.5 MG PO TBCR Oral Take 5 mg by mouth at bedtime as needed.        BP 128/66  Pulse 55  Temp(Src) 98.3 F (36.8 C) (Oral)  Resp 17  Ht 5\' 4"  (1.626 m)  Wt 160 lb (72.576 kg)  BMI 27.46 kg/m2  SpO2 94%  Physical Exam  Nursing note and vitals reviewed. Constitutional: She is oriented to person, place, and time. She appears well-developed and well-nourished. No distress.  HENT:  Head: Normocephalic and atraumatic.  Mouth/Throat: Oropharynx is clear and moist.  Neck: Normal range of motion. Neck supple.    Cardiovascular: Normal rate, regular rhythm and normal heart sounds.   Pulmonary/Chest: Effort normal and breath sounds normal. No respiratory distress. She exhibits no tenderness.  Musculoskeletal: Normal range of motion. She exhibits no edema and no tenderness.  Lymphadenopathy:    She has no cervical adenopathy.  Neurological: She is alert and oriented to person, place, and time. No cranial nerve deficit. She exhibits normal muscle tone. Coordination normal.  Skin: Skin is warm and dry.       Intact staple to the soft tissue of the posterior scalp.  No surrounding erythema or drainage.  Laceration is otherwise well healed    ED Course  Procedures (including critical care time)       MDM    Single staple to the posterior scalp removed by me after local anesthesia using 1% plain lidocaine and removed completely using forceps.  Pt tolerated well.  No bleeding.  Laceration is well healed        Dalyce Renne L. Blaine Guiffre, [place] 10/11/10 1733

## 2010-10-10 NOTE — ED Notes (Signed)
Pt needs staple removed from her head. Placed in ED on 10/01/10. Home health nurse was unable to remove one of the staples.

## 2010-10-13 NOTE — ED Provider Notes (Signed)
Medical screening examination/treatment/procedure(s) were performed by non-physician practitioner and as supervising physician I was immediately available for consultation/collaboration.   Shelda Jakes, MD 10/13/10 [phone]

## 2010-10-18 ENCOUNTER — Encounter (HOSPITAL_COMMUNITY): Payer: Self-pay | Admitting: Emergency Medicine

## 2010-10-18 ENCOUNTER — Emergency Department (HOSPITAL_COMMUNITY)
Admission: EM | Admit: 2010-10-18 | Discharge: 2010-10-18 | Disposition: A | Discharge status: Home / Self Care | Payer: PRIVATE HEALTH INSURANCE | Attending: Emergency Medicine | Admitting: Emergency Medicine

## 2010-10-18 ENCOUNTER — Emergency Department (HOSPITAL_COMMUNITY): Payer: PRIVATE HEALTH INSURANCE

## 2010-10-18 DIAGNOSIS — F0280 Dementia in other diseases classified elsewhere without behavioral disturbance: Secondary | ICD-10-CM | POA: Insufficient documentation

## 2010-10-18 DIAGNOSIS — N390 Urinary tract infection, site not specified: Secondary | ICD-10-CM | POA: Insufficient documentation

## 2010-10-18 DIAGNOSIS — H10029 Other mucopurulent conjunctivitis, unspecified eye: Secondary | ICD-10-CM | POA: Insufficient documentation

## 2010-10-18 DIAGNOSIS — K219 Gastro-esophageal reflux disease without esophagitis: Secondary | ICD-10-CM | POA: Insufficient documentation

## 2010-10-18 DIAGNOSIS — E119 Type 2 diabetes mellitus without complications: Secondary | ICD-10-CM | POA: Insufficient documentation

## 2010-10-18 DIAGNOSIS — S42009A Fracture of unspecified part of unspecified clavicle, initial encounter for closed fracture: Secondary | ICD-10-CM | POA: Insufficient documentation

## 2010-10-18 DIAGNOSIS — G309 Alzheimer's disease, unspecified: Secondary | ICD-10-CM | POA: Insufficient documentation

## 2010-10-18 DIAGNOSIS — W19XXXA Unspecified fall, initial encounter: Secondary | ICD-10-CM | POA: Insufficient documentation

## 2010-10-18 DIAGNOSIS — M129 Arthropathy, unspecified: Secondary | ICD-10-CM | POA: Insufficient documentation

## 2010-10-18 DIAGNOSIS — R011 Cardiac murmur, unspecified: Secondary | ICD-10-CM | POA: Insufficient documentation

## 2010-10-18 DIAGNOSIS — I10 Essential (primary) hypertension: Secondary | ICD-10-CM | POA: Insufficient documentation

## 2010-10-18 LAB — DIFFERENTIAL
Basophils Relative: 1 % (ref 0–1)
Eosinophils Absolute: 0.1 10*3/uL (ref 0.0–0.7)
Neutrophils Relative %: 62 % (ref 43–77)

## 2010-10-18 LAB — URINALYSIS, ROUTINE W REFLEX MICROSCOPIC
Bilirubin Urine: NEGATIVE
Nitrite: POSITIVE — AB
Specific Gravity, Urine: 1.025 (ref 1.005–1.030)
Urobilinogen, UA: 0.2 mg/dL (ref 0.0–1.0)

## 2010-10-18 LAB — BASIC METABOLIC PANEL
BUN: 24 mg/dL — ABNORMAL HIGH (ref 6–23)
GFR calc Af Amer: 75 mL/min — ABNORMAL LOW (ref >90)
GFR calc non Af Amer: 65 mL/min — ABNORMAL LOW (ref >90)
Potassium: 4.1 mEq/L (ref 3.5–5.1)
Sodium: 140 mEq/L (ref 135–145)

## 2010-10-18 LAB — CBC
MCH: 31.8 pg (ref 26.0–34.0)
MCHC: 32.4 g/dL (ref 30.0–36.0)
Platelets: 195 10*3/uL (ref 150–400)
RDW: 13.7 % (ref 11.5–15.5)

## 2010-10-18 LAB — URINE MICROSCOPIC-ADD ON

## 2010-10-18 MED ORDER — ERYTHROMYCIN 5 MG/GM OP OINT
TOPICAL_OINTMENT | Freq: Once | OPHTHALMIC | Status: AC
  Administered 2010-10-19: via OPHTHALMIC
  Filled 2010-10-18: qty 3.5

## 2010-10-18 NOTE — ED Notes (Signed)
Pt taken to car in wheelchair with [facility] staff. Pt no stated needs no noted distress

## 2010-10-18 NOTE — ED Notes (Signed)
Pt stated at 1500 left shoulder pain, abrasion to same shoulder

## 2010-10-18 NOTE — ED Notes (Signed)
Call and report off to jessica at receiving facility they will sent transportation in 1-1.5 hours charge rn notified.

## 2010-10-18 NOTE — ED Notes (Signed)
Performed in and out cath to collect urine from patient per nurse

## 2010-10-18 NOTE — ED Notes (Signed)
Pt's right eye matted shut with yellowish discharge, dr notified.

## 2010-10-18 NOTE — ED Provider Notes (Signed)
History  Scribed for Dr. Jeraldine Loots, the patient was seen in room APA02. The chart was scribed by Gilman Schmidt. The patients care was started at 1940. CSN: [id number] Arrival date & time: 10/18/2010  7:16 PM  Chief Complaint  Patient presents with  . Shoulder Pain    The history is limited by the condition of the patient.  Level 5 Caveat. Pt has dementia.   [name] is a 77 y.o. female who presents to the Emergency Department complaining of left shoulder pain. Additionally notes abrasion on left shoulder.    Past Medical History  Diagnosis Date  . Arthritis   . GE reflux   . Alzheimer's disease   . Diabetes mellitus   . Hypertension     History reviewed. No pertinent past surgical history.  No family history on file.  History  Substance Use Topics  . Smoking status: Not on file  . Smokeless tobacco: Not on file  . Alcohol Use: No    OB History    Grav Para Term Preterm Abortions TAB SAB Ect Mult Living                   Review of Systems  Musculoskeletal:       Shoulder pain   Level 5 Caveat. ROS is limited by condition of the patient.    Allergies  Penicillins  Home Medications   Current Outpatient Rx  Name Route Sig Dispense Refill  . ALENDRONATE SODIUM 70 MG PO TABS Oral Take 70 mg by mouth every 7 (seven) days. Take with a full glass of water on an empty stomach.     . OS-CAL 500 + D PO Oral Take 500 mg by mouth 2 (two) times daily.     . CEPHALEXIN 500 MG PO CAPS Oral Take 500 mg by mouth 2 (two) times daily. Take for 7 days started 09-24-10     . DONEPEZIL HCL 10 MG PO TABS Oral Take 10 mg by mouth at bedtime as needed.      Marland Kitchen DOXYCYCLINE HYCLATE 100 MG PO CAPS Oral Take 100 mg by mouth 2 (two) times daily.      Marland Kitchen VITAMIN D2 PO Oral Take 1.25 mg by mouth once a week. On Fridays of each week    . BACID PO TABS Oral Take 1 tablet by mouth daily.      Marland Kitchen LISINOPRIL 30 MG PO TABS Oral Take 30 mg by mouth daily.      Marland Kitchen LORAZEPAM 0.5 MG PO TABS Oral  Take 0.5 mg by mouth every 8 (eight) hours as needed.      Marland Kitchen MAGNESIUM HYDROXIDE 400 MG/5ML PO SUSP Oral Take by mouth daily as needed.      Marland Kitchen MEMANTINE HCL 10 MG PO TABS Oral Take 10 mg by mouth 2 (two) times daily.      Marland Kitchen METOPROLOL SUCCINATE 50 MG PO TB24 Oral Take 50 mg by mouth daily.      Marland Kitchen CARNATION INST BREAKFAST JUICE PO Oral Take 1 Can by mouth 3 (three) times daily.      Marland Kitchen PAROXETINE HCL 40 MG PO TABS Oral Take 40 mg by mouth every morning.      Marland Kitchen RISA-BID PROBIOTIC PO Oral Take 1 tablet by mouth daily.      . QUETIAPINE FUMARATE 50 MG PO TABS Oral Take 50 mg by mouth at bedtime.      Marland Kitchen ZINC OXIDE 20 % EX OINT Topical Apply 1 application  topically as needed.      Marland Kitchen ZOLPIDEM TARTRATE ER 12.5 MG PO TBCR Oral Take 5 mg by mouth at bedtime as needed.        BP 115/58  Pulse 55  Temp(Src) 97.7 F (36.5 C) (Oral)  Resp 17  SpO2 96%  Physical Exam  Constitutional: She is oriented to person, place, and time.  Non-toxic appearance. She does not have a sickly appearance.  HENT:  Head: Normocephalic and atraumatic.  Eyes: Conjunctivae, EOM and lids are normal. Pupils are equal, round, and reactive to light. No scleral icterus.  Neck: Trachea normal and normal range of motion. Neck supple.  Cardiovascular: Normal rate and regular rhythm.   Murmur heard.  Systolic murmur is present  Pulmonary/Chest: Effort normal and breath sounds normal.  Abdominal: Soft. Normal appearance. There is no tenderness. There is no rebound, no guarding and no CVA tenderness.  Musculoskeletal: She exhibits no edema.       Tender on palpation with left shoulder Restricted ROM in shoulder Moves Elbow and wrist   Neurological: She is alert and oriented to person, place, and time. She has normal strength.  Skin: Skin is warm, dry and intact. No rash noted.    ED Course  Procedures  DIAGNOSTIC STUDIES: Oxygen Saturation is 96% on room air, normal by my interpretation.    COORDINATION OF CARE: 1940:  -  Patient evaluated by ED physician, DG Humerus, DG Shoulder, labs, and UA ordered  Results for orders placed during the hospital encounter of 10/18/10  CBC      Component Value Range   WBC 6.7  4.0 - 10.5 (K/uL)   RBC 3.58 (*) 3.87 - 5.11 (MIL/uL)   Hemoglobin 11.4 (*) 12.0 - 15.0 (g/dL)   HCT 16.1 (*) 09.6 - 46.0 (%)   MCV 98.3  78.0 - 100.0 (fL)   MCH 31.8  26.0 - 34.0 (pg)   MCHC 32.4  30.0 - 36.0 (g/dL)   RDW 04.5  40.9 - 81.1 (%)   Platelets 195  150 - 400 (K/uL)  DIFFERENTIAL      Component Value Range   Neutrophils Relative 62  43 - 77 (%)   Neutro Abs 4.2  1.7 - 7.7 (K/uL)   Lymphocytes Relative 25  12 - 46 (%)   Lymphs Abs 1.7  0.7 - 4.0 (K/uL)   Monocytes Relative 10  3 - 12 (%)   Monocytes Absolute 0.6  0.1 - 1.0 (K/uL)   Eosinophils Relative 2  0 - 5 (%)   Eosinophils Absolute 0.1  0.0 - 0.7 (K/uL)   Basophils Relative 1  0 - 1 (%)   Basophils Absolute 0.0  0.0 - 0.1 (K/uL)  BASIC METABOLIC PANEL      Component Value Range   Sodium 140  135 - 145 (mEq/L)   Potassium 4.1  3.5 - 5.1 (mEq/L)   Chloride 101  96 - 112 (mEq/L)   CO2 31  19 - 32 (mEq/L)   Glucose, Bld 105 (*) 70 - 99 (mg/dL)   BUN 24 (*) 6 - 23 (mg/dL)   Creatinine, Ser 9.14  0.50 - 1.10 (mg/dL)   Calcium 8.7  8.4 - 78.2 (mg/dL)   GFR calc non Af Amer 65 (*) >90 (mL/min)   GFR calc Af Amer 75 (*) >90 (mL/min)  URINALYSIS, ROUTINE W REFLEX MICROSCOPIC      Component Value Range   Color, Urine YELLOW  YELLOW    Appearance CLEAR  CLEAR  Specific Gravity, Urine 1.025  1.005 - 1.030    pH 6.0  5.0 - 8.0    Glucose, UA NEGATIVE  NEGATIVE (mg/dL)   Hgb urine dipstick SMALL (*) NEGATIVE    Bilirubin Urine NEGATIVE  NEGATIVE    Ketones, ur NEGATIVE  NEGATIVE (mg/dL)   Protein, ur NEGATIVE  NEGATIVE (mg/dL)   Urobilinogen, UA 0.2  0.0 - 1.0 (mg/dL)   Nitrite POSITIVE (*) NEGATIVE    Leukocytes, UA NEGATIVE  NEGATIVE   URINE MICROSCOPIC-ADD ON      Component Value Range   Squamous Epithelial / LPF  RARE  RARE    WBC, UA 0-2  <3 (WBC/hpf)   RBC / HPF 0-2  <3 (RBC/hpf)   Bacteria, UA MANY (*) RARE     Results for orders placed during the hospital encounter of 10/18/10  CBC      Component Value Range   WBC 6.7  4.0 - 10.5 (K/uL)   RBC 3.58 (*) 3.87 - 5.11 (MIL/uL)   Hemoglobin 11.4 (*) 12.0 - 15.0 (g/dL)   HCT 16.1 (*) 09.6 - 46.0 (%)   MCV 98.3  78.0 - 100.0 (fL)   MCH 31.8  26.0 - 34.0 (pg)   MCHC 32.4  30.0 - 36.0 (g/dL)   RDW 04.5  40.9 - 81.1 (%)   Platelets 195  150 - 400 (K/uL)  DIFFERENTIAL      Component Value Range   Neutrophils Relative 62  43 - 77 (%)   Neutro Abs 4.2  1.7 - 7.7 (K/uL)   Lymphocytes Relative 25  12 - 46 (%)   Lymphs Abs 1.7  0.7 - 4.0 (K/uL)   Monocytes Relative 10  3 - 12 (%)   Monocytes Absolute 0.6  0.1 - 1.0 (K/uL)   Eosinophils Relative 2  0 - 5 (%)   Eosinophils Absolute 0.1  0.0 - 0.7 (K/uL)   Basophils Relative 1  0 - 1 (%)   Basophils Absolute 0.0  0.0 - 0.1 (K/uL)  BASIC METABOLIC PANEL      Component Value Range   Sodium 140  135 - 145 (mEq/L)   Potassium 4.1  3.5 - 5.1 (mEq/L)   Chloride 101  96 - 112 (mEq/L)   CO2 31  19 - 32 (mEq/L)   Glucose, Bld 105 (*) 70 - 99 (mg/dL)   BUN 24 (*) 6 - 23 (mg/dL)   Creatinine, Ser 9.14  0.50 - 1.10 (mg/dL)   Calcium 8.7  8.4 - 78.2 (mg/dL)   GFR calc non Af Amer 65 (*) >90 (mL/min)   GFR calc Af Amer 75 (*) >90 (mL/min)  URINALYSIS, ROUTINE W REFLEX MICROSCOPIC      Component Value Range   Color, Urine YELLOW  YELLOW    Appearance CLEAR  CLEAR    Specific Gravity, Urine 1.025  1.005 - 1.030    pH 6.0  5.0 - 8.0    Glucose, UA NEGATIVE  NEGATIVE (mg/dL)   Hgb urine dipstick SMALL (*) NEGATIVE    Bilirubin Urine NEGATIVE  NEGATIVE    Ketones, ur NEGATIVE  NEGATIVE (mg/dL)   Protein, ur NEGATIVE  NEGATIVE (mg/dL)   Urobilinogen, UA 0.2  0.0 - 1.0 (mg/dL)   Nitrite POSITIVE (*) NEGATIVE    Leukocytes, UA NEGATIVE  NEGATIVE   URINE MICROSCOPIC-ADD ON      Component Value Range    Squamous Epithelial / LPF RARE  RARE    WBC, UA 0-2  <3 (WBC/hpf)  RBC / HPF 0-2  <3 (RBC/hpf)   Bacteria, UA MANY (*) RARE      Dg Shoulder Left  10/18/2010  *RADIOLOGY REPORT*  Clinical Data: Anterior shoulder bruising, fall, confusion, lethargy  LEFT SHOULDER - 2+ VIEW  Comparison: Left humeral radiographs 10/18/2010  Findings: AC joint alignment normal. Mildly displaced fracture distal left clavicle with minimal apex cranial angulation. Fracture extends to the margin of the [facility] joint. No additional fracture or dislocation identified. Visualized left ribs intact.  IMPRESSION: Minimally displaced and angulated distal left clavicular fracture near [facility] joint.  Original Report Authenticated By: Lollie Marrow, M.D.   Dg Humerus Left  10/18/2010  *RADIOLOGY REPORT*  Clinical Data: Bruising anterior shoulder, fall, confusion, lethargy  LEFT HUMERUS - 2+ VIEW  Comparison: Left elbow radiographs 11/01/2009  Findings: Osseous demineralization. AC joint alignment appears grossly normal. Distal left clavicle appears deformed though poorly evaluated on this exam. Possibility of a distal left clavicular fracture is raised. Humerus appears intact. Deformity at left radial head corresponds to sequela of prior radial head fracture. No additional focal bony abnormality identified.  IMPRESSION: Deformity of left radial head consistent with remote fracture. Questionable fracture of distal left clavicle; recommend dedicated left shoulder radiographs.  Original Report Authenticated By: Lollie Marrow, M.D.      MDM  I personally performed the services described in this documentation, which was scribed in my presence. The recorded information has been reviewed and considered.  This elderly F presents after a fall w L shoulder pain and is found to have clavicle fracture.   Labs also notable for UTI, for which the patient is currently taking keflex.  Absence of fever or leukocytosis is reassuring.  The patient was also  noted to have conjunctivitis, for which she received erythromycin ointment.  The patient was d/c in stable condition. w ortho and pmd f/u directed.  Gerhard Munch, MD 10/21/10 [id number]

## 2010-10-19 ENCOUNTER — Other Ambulatory Visit: Payer: Self-pay

## 2010-10-19 ENCOUNTER — Emergency Department (HOSPITAL_COMMUNITY)
Admission: EM | Admit: 2010-10-19 | Discharge: 2010-10-19 | Disposition: A | Discharge status: Home / Self Care | Payer: PRIVATE HEALTH INSURANCE | Attending: Emergency Medicine | Admitting: Emergency Medicine

## 2010-10-19 ENCOUNTER — Encounter (HOSPITAL_COMMUNITY): Payer: Self-pay | Admitting: Emergency Medicine

## 2010-10-19 ENCOUNTER — Emergency Department (HOSPITAL_COMMUNITY): Payer: PRIVATE HEALTH INSURANCE

## 2010-10-19 DIAGNOSIS — I10 Essential (primary) hypertension: Secondary | ICD-10-CM | POA: Insufficient documentation

## 2010-10-19 DIAGNOSIS — M25559 Pain in unspecified hip: Secondary | ICD-10-CM | POA: Insufficient documentation

## 2010-10-19 DIAGNOSIS — W1809XA Striking against other object with subsequent fall, initial encounter: Secondary | ICD-10-CM | POA: Insufficient documentation

## 2010-10-19 DIAGNOSIS — G309 Alzheimer's disease, unspecified: Secondary | ICD-10-CM | POA: Insufficient documentation

## 2010-10-19 DIAGNOSIS — R000 Tachycardia, unspecified: Secondary | ICD-10-CM | POA: Insufficient documentation

## 2010-10-19 DIAGNOSIS — Z79899 Other long term (current) drug therapy: Secondary | ICD-10-CM | POA: Insufficient documentation

## 2010-10-19 DIAGNOSIS — Y9210 Unspecified residential institution as the place of occurrence of the external cause: Secondary | ICD-10-CM | POA: Insufficient documentation

## 2010-10-19 DIAGNOSIS — G319 Degenerative disease of nervous system, unspecified: Secondary | ICD-10-CM | POA: Insufficient documentation

## 2010-10-19 DIAGNOSIS — F0280 Dementia in other diseases classified elsewhere without behavioral disturbance: Secondary | ICD-10-CM | POA: Insufficient documentation

## 2010-10-19 DIAGNOSIS — E119 Type 2 diabetes mellitus without complications: Secondary | ICD-10-CM | POA: Insufficient documentation

## 2010-10-19 DIAGNOSIS — T148XXA Other injury of unspecified body region, initial encounter: Secondary | ICD-10-CM | POA: Insufficient documentation

## 2010-10-19 DIAGNOSIS — IMO0002 Reserved for concepts with insufficient information to code with codable children: Secondary | ICD-10-CM | POA: Insufficient documentation

## 2010-10-19 DIAGNOSIS — K219 Gastro-esophageal reflux disease without esophagitis: Secondary | ICD-10-CM | POA: Insufficient documentation

## 2010-10-19 DIAGNOSIS — R4182 Altered mental status, unspecified: Secondary | ICD-10-CM | POA: Insufficient documentation

## 2010-10-19 DIAGNOSIS — W19XXXA Unspecified fall, initial encounter: Secondary | ICD-10-CM

## 2010-10-19 NOTE — ED Provider Notes (Signed)
History   This chart was scribed for Maureen Octave, MD by [name]. The patient was seen in room APA16A/APA16A and the patient's care was started at 9:22AM.   CSN: [id number] Arrival date & time: 10/19/2010  9:14 AM  Chief Complaint  Patient presents with  . Fall    lsb    (Consider location/radiation/quality/duration/timing/severity/associated sxs/prior treatment) HPI A Level 5 Caveat Applies due to Alzheimer's Disease. [name] is a 77 y.o. female who presents to the Emergency Department after fall this morning in which patient fell face fist into a table which was witnessed by nursing home staff. Patient is amnestic to events of fall but currently complains of diffuse pain and is unable to localize pain to any specific regions. Patient was evaluated in ED yesterday for fall as well. Patient with h/o diabetes, hypertension, Alzheimer's Disease, arthritis and GERD.   Past Medical History  Diagnosis Date  . Arthritis   . GE reflux   . Alzheimer's disease   . Diabetes mellitus   . Hypertension     History reviewed. No pertinent past surgical history.  History reviewed. No pertinent family history.  History  Substance Use Topics  . Smoking status: Not on file  . Smokeless tobacco: Not on file  . Alcohol Use: No    OB History    Grav Para Term Preterm Abortions TAB SAB Ect Mult Living                  Review of Systems  Unable to perform ROS: Other   Allergies  Penicillins  Home Medications   Current Outpatient Rx  Name Route Sig Dispense Refill  . OS-CAL 500 + D PO Oral Take 500 mg by mouth 2 (two) times daily.     . DONEPEZIL HCL 10 MG PO TABS Oral Take 10 mg by mouth at bedtime as needed.      . ERYTHROMYCIN 5 MG/GM OP OINT Right Eye Place 1 application into the right eye 2 (two) times daily.      Marland Kitchen BACID PO TABS Oral Take 1 tablet by mouth daily.      Marland Kitchen LISINOPRIL 30 MG PO TABS Oral Take 30 mg by mouth daily.      Marland Kitchen MEMANTINE HCL 10 MG PO  TABS Oral Take 10 mg by mouth 2 (two) times daily.      Marland Kitchen METOPROLOL SUCCINATE 50 MG PO TB24 Oral Take 50 mg by mouth daily.      Marland Kitchen CARNATION INST BREAKFAST JUICE PO Oral Take 1 Can by mouth 3 (three) times daily.      Marland Kitchen PAROXETINE HCL 40 MG PO TABS Oral Take 40 mg by mouth every morning.      Marland Kitchen RISA-BID PROBIOTIC PO Oral Take 1 tablet by mouth daily.      . QUETIAPINE FUMARATE 50 MG PO TABS Oral Take 50 mg by mouth at bedtime.      . ALENDRONATE SODIUM 70 MG PO TABS Oral Take 70 mg by mouth every 7 (seven) days. Take with a full glass of water on an empty stomach.     . CEPHALEXIN 500 MG PO CAPS Oral Take 500 mg by mouth 2 (two) times daily. Take for 7 days started 09-24-10     . DOXYCYCLINE HYCLATE 100 MG PO CAPS Oral Take 100 mg by mouth 2 (two) times daily.      Marland Kitchen VITAMIN D2 PO Oral Take 1.25 mg by mouth once a week. On Fridays  of each week    . LORAZEPAM 0.5 MG PO TABS Oral Take 0.5 mg by mouth every 8 (eight) hours as needed.      Marland Kitchen MAGNESIUM HYDROXIDE 400 MG/5ML PO SUSP Oral Take by mouth daily as needed.      Marland Kitchen ZINC OXIDE 20 % EX OINT Topical Apply 1 application topically as needed.     Marland Kitchen ZOLPIDEM TARTRATE ER 12.5 MG PO TBCR Oral Take 5 mg by mouth at bedtime as needed.        BP 138/73  Pulse 97  Temp(Src) 98.4 F (36.9 C) (Oral)  Resp 16  SpO2 100%  Physical Exam  Nursing note and vitals reviewed. Constitutional: She is oriented to person, place, and time. She appears well-developed and well-nourished. No distress.  HENT:  Head: Normocephalic and atraumatic.  Eyes: EOM are normal. Pupils are equal, round, and reactive to light.  Neck: Neck supple. No tracheal deviation present.       Immobilized in C-collar.   Cardiovascular: Regular rhythm.  Tachycardia present.  Exam reveals no friction rub.   No murmur heard. Pulmonary/Chest: Effort normal. No respiratory distress. She has no wheezes. She exhibits no tenderness.  Abdominal: Soft. She exhibits no distension. There is no  tenderness.  Musculoskeletal: Normal range of motion. She exhibits no edema.       Immobilized on long spine board. T and L spine with no step-offs or tenderness noted. C- spine tender to palpation. Pelvis stable. Right thigh and right knee tender to palpation.  Neurological: She is alert and oriented to person, place, and time. No sensory deficit.       Strength normal and equal of bilateral lower extremities.   Skin: Skin is warm and dry.       Small abrasion over left patella.   Psychiatric: She has a normal mood and affect. Her behavior is normal.    ED Course  Procedures (including critical care time)  DIAGNOSTIC STUDIES: Oxygen Saturation is 100% on room air, normal by my interpretation.    COORDINATION OF CARE: 11:00AM- Patient informed of imaging results. Reports no pain at this time. C-collar removed.  11:11AM- Patient's nursing home called an informed of patient's imaging results and evaluation in ED. Informed of intent to d/c patient home to nursing home.   Labs Reviewed - No data to display Dg Chest 1 View  10/19/2010  *RADIOLOGY REPORT*  Clinical Data: Fall  CHEST - 1 VIEW  Comparison: CT chest dated 02/11/2005  Findings: Chronic interstitial markings.  Left basilar opacity reflects a fat-containing left Bochdalek hernia when correlating with prior CT.  No pleural effusion or pneumothorax.  The heart is top normal in size.  Degenerative changes of the visualized thoracolumbar spine.  IMPRESSION: No evidence of acute cardiopulmonary disease.  Original Report Authenticated By: Charline Bills, M.D.   Dg Hip Complete Left  10/19/2010  *RADIOLOGY REPORT*  Clinical Data: Fall.  Pain.  LEFT HIP - COMPLETE 2+ VIEW  Comparison: 05/18/2010  Findings: AP and frog-leg lateral views of the left hip show no evidence for fracture.  No worrisome lytic or sclerotic osseous abnormality.  The left SI joint and the symphysis pubis are normal.  IMPRESSION: No acute bony findings.  Original Report  Authenticated By: ERIC A. MANSELL, M.D.   Dg Hip Complete Right  10/19/2010  *RADIOLOGY REPORT*  Clinical Data: Fall, pain  RIGHT HIP - COMPLETE 2+ VIEW  Comparison: None.  Findings: No fracture or dislocation is seen.  Bilateral  hip joint spaces are symmetric.  Visualized bony pelvis appears intact.  Degenerative changes of the lower lumbar spine.  IMPRESSION: No fracture or dislocation is seen.  Original Report Authenticated By: Charline Bills, M.D.   Dg Femur Right  10/19/2010  *RADIOLOGY REPORT*  Clinical Data: Fall  RIGHT FEMUR - 2 VIEW  Comparison: None.  Findings: Status post medial hemiarthroplasty.  No fracture or dislocation is seen.  The visualized soft tissues are unremarkable.  IMPRESSION: No fracture or dislocation is seen.  Status post medial hemiarthroplasty.  Original Report Authenticated By: Charline Bills, M.D.   Ct Head Wo Contrast  10/19/2010  *RADIOLOGY REPORT*  Clinical Data:  Fall, altered mental status  CT HEAD WITHOUT CONTRAST CT CERVICAL SPINE WITHOUT CONTRAST  Technique:  Multidetector CT imaging of the head and cervical spine was performed following the standard protocol without intravenous contrast.  Multiplanar CT image reconstructions of the cervical spine were also generated.  Comparison:  10/01/2010  CT HEAD  Findings: No evidence of parenchymal hemorrhage or extra-axial fluid collection. No mass lesion, mass effect, or midline shift.  No CT evidence of acute infarction.  Subcortical white matter and periventricular small vessel ischemic changes.  Atrophy with secondary ventricular prominence.  The visualized paranasal sinuses are essentially clear. The mastoid air cells are unopacified.  No evidence of calvarial fracture.  IMPRESSION: No evidence of acute intracranial abnormality.  Atrophy with small vessel ischemic changes.  CT CERVICAL SPINE  Findings: Stable alignment.  No evidence of fracture or dislocation.  Vertebral body heights are maintained.  The dens  appears intact.  No prevertebral soft tissue swelling.  Multilevel degenerative changes.  Visualized thyroid is unremarkable.  Visualized lung apices are clear.  IMPRESSION: No evidence of traumatic injury to the cervical spine.  Multilevel degenerative changes with stable alignment.  Original Report Authenticated By: Charline Bills, M.D.   Ct Cervical Spine Wo Contrast  10/19/2010  *RADIOLOGY REPORT*  Clinical Data:  Fall, altered mental status  CT HEAD WITHOUT CONTRAST CT CERVICAL SPINE WITHOUT CONTRAST  Technique:  Multidetector CT imaging of the head and cervical spine was performed following the standard protocol without intravenous contrast.  Multiplanar CT image reconstructions of the cervical spine were also generated.  Comparison:  10/01/2010  CT HEAD  Findings: No evidence of parenchymal hemorrhage or extra-axial fluid collection. No mass lesion, mass effect, or midline shift.  No CT evidence of acute infarction.  Subcortical white matter and periventricular small vessel ischemic changes.  Atrophy with secondary ventricular prominence.  The visualized paranasal sinuses are essentially clear. The mastoid air cells are unopacified.  No evidence of calvarial fracture.  IMPRESSION: No evidence of acute intracranial abnormality.  Atrophy with small vessel ischemic changes.  CT CERVICAL SPINE  Findings: Stable alignment.  No evidence of fracture or dislocation.  Vertebral body heights are maintained.  The dens appears intact.  No prevertebral soft tissue swelling.  Multilevel degenerative changes.  Visualized thyroid is unremarkable.  Visualized lung apices are clear.  IMPRESSION: No evidence of traumatic injury to the cervical spine.  Multilevel degenerative changes with stable alignment.  Original Report Authenticated By: Charline Bills, M.D.   Dg Shoulder Left  10/18/2010  *RADIOLOGY REPORT*  Clinical Data: Anterior shoulder bruising, fall, confusion, lethargy  LEFT SHOULDER - 2+ VIEW  Comparison:  Left humeral radiographs 10/18/2010  Findings: AC joint alignment normal. Mildly displaced fracture distal left clavicle with minimal apex cranial angulation. Fracture extends to the margin of the [facility] joint. No additional fracture  or dislocation identified. Visualized left ribs intact.  IMPRESSION: Minimally displaced and angulated distal left clavicular fracture near [facility] joint.  Original Report Authenticated By: Lollie Marrow, M.D.   Dg Knee Complete 4 Views Left  10/19/2010  *RADIOLOGY REPORT*  Clinical Data: Fall  LEFT KNEE - COMPLETE 4+ VIEW  Comparison: None.  Findings: No fracture or dislocation is seen.  Mild degenerative changes of the left knee.  The visualized soft tissues are unremarkable.  No suprapatellar knee joint effusion.  IMPRESSION: No fracture or dislocation is seen.  Mild degenerative changes.  Original Report Authenticated By: Charline Bills, M.D.   Dg Knee Complete 4 Views Right  10/19/2010  *RADIOLOGY REPORT*  Clinical Data: Fall  RIGHT KNEE - COMPLETE 4+ VIEW  Comparison: None.  Findings: Status post medial hemiarthroplasty.  No evidence of hardware complication.  Mild degenerative changes of the lateral and patellofemoral limits.  No fracture or dislocation is seen.  The visualized soft tissues are unremarkable.  No suprapatellar knee joint effusion.  IMPRESSION: No fracture or dislocation is seen.  Status post medial hemiarthroplasty without evidence of hardware complication.  Original Report Authenticated By: Charline Bills, M.D.   Dg Humerus Left  10/18/2010  *RADIOLOGY REPORT*  Clinical Data: Bruising anterior shoulder, fall, confusion, lethargy  LEFT HUMERUS - 2+ VIEW  Comparison: Left elbow radiographs 11/01/2009  Findings: Osseous demineralization. AC joint alignment appears grossly normal. Distal left clavicle appears deformed though poorly evaluated on this exam. Possibility of a distal left clavicular fracture is raised. Humerus appears intact. Deformity at left  radial head corresponds to sequela of prior radial head fracture. No additional focal bony abnormality identified.  IMPRESSION: Deformity of left radial head consistent with remote fracture. Questionable fracture of distal left clavicle; recommend dedicated left shoulder radiographs.  Original Report Authenticated By: Lollie Marrow, M.D.     No diagnosis found.    Date: 10/19/2010  Rate: 61  Rhythm: normal sinus rhythm  QRS Axis: normal  Intervals: normal  ST/T Wave abnormalities: normal  Conduction Disutrbances:none  Narrative Interpretation:   Old EKG Reviewed: none available   MDM  Demented patient with reported fall after walking into table.  Seen yesterday after a fall and diagnosed with clavicle fracture.  Patient is unable to provide history or tell where she is hurting. She does seem to have pain in her right upper leg.  Extensive imaging is negative for any acute fracture. Patient appears to be at her baseline this is confirmed with the nurse at the nursing home. Her C-spine is cleared.  Stable for return to her nursing home. She will keep the sling in place for her clavicle fracture.   I personally performed the services described in this documentation, which was scribed in my presence.  The recorded information has been reviewed and considered.    Maureen Octave, MD 10/19/10 1159

## 2010-10-19 NOTE — ED Notes (Signed)
Pt here yesterday due to fall and L collar bone fx. Larey Seat against a table today. Pt is from [facility]. Pt only c/o L knee pain at this time. Nad. Alert/roiented to some. Pt fully immobilized. Calm.

## 2010-10-19 NOTE — ED Notes (Signed)
Report called to Greer Pickerel at [facility], was told that transportation may not arrive for an hour to hour and a half

## 2010-10-28 NOTE — H&P (Signed)
  NAME:  [name], [name] NO.:  [id number]  MEDICAL RECORD NO.:  [id number]          PATIENT TYPE:  REC  LOCATION:  FOOT                         FACILITY:  [facility]  PHYSICIAN:  Joanne Gavel, M.D.        DATE OF BIRTH:  March 29, 1921  DATE OF ADMISSION:  10/28/2009 DATE OF DISCHARGE:                             HISTORY & PHYSICAL   CHIEF COMPLAINT:  Ulceration on left leg.  HISTORY OF PRESENT ILLNESS:  This is an 77 year old female with approximately 83-month history of what appears to be venous stasis who developed a small ulceration several weeks ago on the left foreleg. This is relatively painless.  There has been no fever, chills or sweating spells.  The patient has no history of intermittent claudication of peripheral vascular disease.  She does have a history of hypertension.  PAST SURGICAL HISTORY:  Arthroscopy, right knee.  Cigarettes none.  Alcohol none.  MEDICATIONS:  Lisinopril, Toprol, donepezil, Namenda, vitamin D, Fosamax, vitamin C.  ALLERGY:  Possibly PENICILLIN.  PHYSICAL EXAMINATION:  VITAL SIGNS:  Temperature 97.9, pulse 58, respirations 18, blood pressure 133/70. GENERAL APPEARANCE:  Well-developed, well-nourished in no distress. Normal symmetrical strength. CHEST and HEART:  Negative. ABDOMEN:  Benign. EXTREMITIES:  Good peripheral pulses.  Normal skin nutrition of the feet.  There is some stasis changes of both forelegs with thickening of the skin and some red discoloration.  There is a small ulceration on the left anterior foreleg, 0.5 x 0.5.  This is cleared of slough.  The wound is relatively clean.  IMPRESSION:  Varicose venous ulcer, left lower extremity.  PLAN:  Santyl, hydrogel, and Unna boot.     Joanne Gavel, M.D.     RA/MEDQ  D:  10/29/2009  T:  10/30/2009  Job:  [id number]  Electronically Signed by Joanne Gavel M.D. on 10/28/2010 08:45:52 AM

## 2011-03-10 ENCOUNTER — Emergency Department (HOSPITAL_COMMUNITY): Payer: PRIVATE HEALTH INSURANCE

## 2011-03-10 ENCOUNTER — Emergency Department (HOSPITAL_COMMUNITY)
Admission: EM | Admit: 2011-03-10 | Discharge: 2011-03-10 | Disposition: A | Discharge status: Skilled Nursing Facility | Payer: PRIVATE HEALTH INSURANCE | Attending: Emergency Medicine | Admitting: Emergency Medicine

## 2011-03-10 ENCOUNTER — Encounter (HOSPITAL_COMMUNITY): Payer: Self-pay

## 2011-03-10 DIAGNOSIS — S0083XA Contusion of other part of head, initial encounter: Secondary | ICD-10-CM

## 2011-03-10 DIAGNOSIS — IMO0002 Reserved for concepts with insufficient information to code with codable children: Secondary | ICD-10-CM | POA: Insufficient documentation

## 2011-03-10 DIAGNOSIS — E119 Type 2 diabetes mellitus without complications: Secondary | ICD-10-CM | POA: Insufficient documentation

## 2011-03-10 DIAGNOSIS — S42002A Fracture of unspecified part of left clavicle, initial encounter for closed fracture: Secondary | ICD-10-CM

## 2011-03-10 DIAGNOSIS — Z79899 Other long term (current) drug therapy: Secondary | ICD-10-CM | POA: Insufficient documentation

## 2011-03-10 DIAGNOSIS — M5020 Other cervical disc displacement, unspecified cervical region: Secondary | ICD-10-CM | POA: Insufficient documentation

## 2011-03-10 DIAGNOSIS — S0081XA Abrasion of other part of head, initial encounter: Secondary | ICD-10-CM

## 2011-03-10 DIAGNOSIS — S42009A Fracture of unspecified part of unspecified clavicle, initial encounter for closed fracture: Secondary | ICD-10-CM | POA: Insufficient documentation

## 2011-03-10 DIAGNOSIS — S0003XA Contusion of scalp, initial encounter: Secondary | ICD-10-CM | POA: Insufficient documentation

## 2011-03-10 DIAGNOSIS — M129 Arthropathy, unspecified: Secondary | ICD-10-CM | POA: Insufficient documentation

## 2011-03-10 DIAGNOSIS — F0280 Dementia in other diseases classified elsewhere without behavioral disturbance: Secondary | ICD-10-CM | POA: Insufficient documentation

## 2011-03-10 DIAGNOSIS — M25519 Pain in unspecified shoulder: Secondary | ICD-10-CM | POA: Insufficient documentation

## 2011-03-10 DIAGNOSIS — K219 Gastro-esophageal reflux disease without esophagitis: Secondary | ICD-10-CM | POA: Insufficient documentation

## 2011-03-10 DIAGNOSIS — S1093XA Contusion of unspecified part of neck, initial encounter: Secondary | ICD-10-CM | POA: Insufficient documentation

## 2011-03-10 DIAGNOSIS — W19XXXA Unspecified fall, initial encounter: Secondary | ICD-10-CM | POA: Insufficient documentation

## 2011-03-10 DIAGNOSIS — G319 Degenerative disease of nervous system, unspecified: Secondary | ICD-10-CM | POA: Insufficient documentation

## 2011-03-10 DIAGNOSIS — I10 Essential (primary) hypertension: Secondary | ICD-10-CM | POA: Insufficient documentation

## 2011-03-10 DIAGNOSIS — Y9210 Unspecified residential institution as the place of occurrence of the external cause: Secondary | ICD-10-CM | POA: Insufficient documentation

## 2011-03-10 DIAGNOSIS — G309 Alzheimer's disease, unspecified: Secondary | ICD-10-CM | POA: Insufficient documentation

## 2011-03-10 MED ORDER — LORAZEPAM 2 MG/ML IJ SOLN
1.0000 mg | Freq: Once | INTRAMUSCULAR | Status: AC
  Administered 2011-03-10: 1 mg via INTRAMUSCULAR

## 2011-03-10 MED ORDER — LORAZEPAM 2 MG/ML IJ SOLN
INTRAMUSCULAR | Status: AC
  Administered 2011-03-10: 1 mg via INTRAMUSCULAR
  Filled 2011-03-10: qty 1

## 2011-03-10 NOTE — Discharge Instructions (Signed)
RESOURCE GUIDE  Dental Problems  Patients with Medicaid: [facility]                     [facility] [address]                                           [address] Phone:  [phone]                                                  Phone:  [phone]  If unable to pay or uninsured, contact:  [facility] or [facility] to become qualified for the adult dental clinic.  Chronic Pain Problems Contact [facility]  [phone] Patients need to be referred by their primary care doctor.  Insufficient Money for Medicine Contact [employer]:  call "211" or [facility] 271-5999.  No Primary Care Doctor Call [facility]  832-8000 Other agencies that provide inexpensive medical care    [facility]  [phone]    [facility]  [phone]    [facility]  [phone]    Women's Clinic  [phone]    [facility]  [phone]    [facility]  [phone]  Psychological Services [facility]  [phone] [facility]  [phone] [facility]   [phone] (emergency services 641-4993)  Substance Abuse Resources [facility]  336-882-2125 [facility] 336-784-9470 [facility] 336-285-9073 [facility] 336-845-3988 Residential & Outpatient Substance Abuse Program  800-659-3381  Abuse/Neglect [place] Child Abuse Hotline (336) 641-3795 [place] Child Abuse Hotline 800-378-5315 (After Hours)  Emergency Shelter [facility] (336) 271-5985  Maternity Homes [facility] (336) 275-9566 [facility] (704) 372-4663  MRSA Hotline #:   832-7006    [place] [facility]  [facility] [place]     [employer]                          [facility] [address] [place]                       [address]      [address]  [place]                                                [place]                            [place] Phone:  [phone]                                   Phone:  [phone]                 Phone:  [phone]  [facility] Phone:  [phone]    [facility] Child Abuse Hotline 267-679-3161 (220)549-9514 (After Hours)   Take your usual prescriptions as previously directed.  Wear the sling on your left arm until you are seen in follow up by your doctor or the Orthopedist.  Apply moist heat or ice to the area(s) of discomfort, for 15 minutes at a time, several times per day for the next few days.  Do not fall asleep on a heating or ice pack.  Wash the abraided area with soap and water at least twice a day, and cover with a clean/dry dressing.  Change the dressing whenever it becomes wet or soiled.  Call your regular medical doctor and the Orthopedic doctor tomorrow morning to schedule a follow up appointment this week.  Return to the Emergency Department immediately if worsening.

## 2011-03-10 NOTE — ED Notes (Signed)
Un-witnessed fall --pt from [facility], hematoma to left forehead and left shoulder

## 2011-03-10 NOTE — ED Notes (Signed)
[name] called from MRI stating pt is becoming agitated and will not remain still for testing.  EDP notified and orders rec'd. Pt medicated as ordered in MRI.

## 2011-03-10 NOTE — ED Provider Notes (Signed)
History     CSN: [id number]  Arrival date & time 03/10/11  1723   First MD Initiated Contact with Patient 03/10/11 1734      Chief Complaint  Patient presents with  . Fall    Patient is a 78 y.o. female presenting with fall. The history is provided by the nursing home and the EMS personnel. History Limited By: Hx dementia.  Fall  Pt was seen at 1805.  Per EMS and NH report, pt with unwitnessed fall today.  Pt states "I feel fine."  Denies any specific complaints.  Denies CP/SOB, no abd pain, no neck or back pain, no extremity pain.     Past Medical History  Diagnosis Date  . Arthritis   . GE reflux   . Alzheimer's disease   . Diabetes mellitus   . Hypertension     History reviewed. No pertinent past surgical history.   History  Substance Use Topics  . Smoking status: Not on file  . Smokeless tobacco: Not on file  . Alcohol Use: No    Review of Systems  Unable to perform ROS: Dementia    Allergies  Penicillins  Home Medications   Current Outpatient Rx  Name Route Sig Dispense Refill  . ALENDRONATE SODIUM 70 MG PO TABS Oral Take 70 mg by mouth every 7 (seven) days. Take with a full glass of water on an empty stomach.     . OS-CAL 500 + D PO Oral Take 500 mg by mouth 2 (two) times daily.     . CEPHALEXIN 500 MG PO CAPS Oral Take 500 mg by mouth 2 (two) times daily. Take for 7 days started 09-24-10     . DONEPEZIL HCL 10 MG PO TABS Oral Take 10 mg by mouth at bedtime as needed.      Marland Kitchen DOXYCYCLINE HYCLATE 100 MG PO CAPS Oral Take 100 mg by mouth 2 (two) times daily.      Marland Kitchen VITAMIN D2 PO Oral Take 1.25 mg by mouth once a week. On Fridays of each week    . ERYTHROMYCIN 5 MG/GM OP OINT Right Eye Place 1 application into the right eye 2 (two) times daily.      Marland Kitchen BACID PO TABS Oral Take 1 tablet by mouth daily.      Marland Kitchen LISINOPRIL 30 MG PO TABS Oral Take 30 mg by mouth daily.      Marland Kitchen LORAZEPAM 0.5 MG PO TABS Oral Take 0.5 mg by mouth every 8 (eight) hours as needed.        Marland Kitchen MAGNESIUM HYDROXIDE 400 MG/5ML PO SUSP Oral Take by mouth daily as needed.      Marland Kitchen MEMANTINE HCL 10 MG PO TABS Oral Take 10 mg by mouth 2 (two) times daily.      Marland Kitchen METOPROLOL SUCCINATE ER 50 MG PO TB24 Oral Take 50 mg by mouth daily.      Marland Kitchen CARNATION INST BREAKFAST JUICE PO Oral Take 1 Can by mouth 3 (three) times daily.      Marland Kitchen PAROXETINE HCL 40 MG PO TABS Oral Take 40 mg by mouth every morning.      Marland Kitchen RISA-BID PROBIOTIC PO Oral Take 1 tablet by mouth daily.      . QUETIAPINE FUMARATE 50 MG PO TABS Oral Take 50 mg by mouth at bedtime.      Marland Kitchen ZINC OXIDE 20 % EX OINT Topical Apply 1 application topically as needed.     Marland Kitchen ZOLPIDEM TARTRATE ER  12.5 MG PO TBCR Oral Take 5 mg by mouth at bedtime as needed.        BP 138/61  Pulse 56  Temp(Src) 98 F (36.7 C) (Oral)  Resp 16  Wt 160 lb (72.576 kg)  SpO2 98%  Physical Exam 1810: Physical examination:  Nursing notes reviewed; Vital signs and O2 SAT reviewed;  Constitutional: Well developed, Well nourished, Well hydrated, In no acute distress; Head:  Normocephalic, +contusion left lateral forehead with overlying hemostatic superficial abrasion; Eyes: EOMI, PERRL, No scleral icterus; ENMT: Mouth and pharynx normal, Mucous membranes moist; Neck: Supple, Full range of motion, No lymphadenopathy; Cardiovascular: Regular rate and rhythm, No murmur, rub, or gallop; Respiratory: Breath sounds clear & equal bilaterally, No rales, rhonchi, wheezes, or rub, Normal respiratory effort/excursion; Chest: Nontender, no ecchymosis. Movement normal; Abdomen: Soft, Nontender, Nondistended, Normal bowel sounds; Spine:  No midline CS, TS, LS tenderness.; Extremities: Pelvis stable, no deformity, no open wounds, pulses normal, No tenderness, No edema, No calf edema or asymmetry.; Neuro: Awake, alert, confused re: time, place, events.  Speech clear, no facial droop. Major CN grossly intact. Moves all ext well on stretcher with 5/5 equal strength bilat UE's and LE's..; Skin:  Color normal, Warm, Dry, no rash.    ED Course  Procedures   MDM  MDM Reviewed: nursing note and vitals Interpretation: x-ray and CT scan    Dg Chest 2 View 03/10/2011  *RADIOLOGY REPORT*  Clinical Data: Secondary to a fall.  Forehead and left shoulder hematomas.  CHEST - 2 VIEW  Comparison: None.  Findings: There is a comminuted fracture of the distal left clavicle.  Heart size and vascularity are normal.  Lungs are clear although somewhat hyperinflated.  Multiple old healed right rib fractures.  Focal eventration of the posterior medial aspect of the left hemidiaphragm, not felt to be significant.  Thoracolumbar scoliosis.  IMPRESSION: Fracture of the distal left clavicle.  No other significant acute abnormalities.  Original Report Authenticated By: Gwynn Burly, M.D.   Ct Head Wo Contrast 03/10/2011  *RADIOLOGY REPORT*  Clinical Data:  History of fall.  CT HEAD WITHOUT CONTRAST CT CERVICAL SPINE WITHOUT CONTRAST  Technique:  Multidetector CT imaging of the head and cervical spine was performed following the standard protocol without intravenous contrast.  Multiplanar CT image reconstructions of the cervical spine were also generated.  Comparison:  Multiple priors, most recently head CT 10/19/2010.  CT HEAD  Findings: No definite acute intracranial abnormalities. Specifically, no definite evidence of acute infarction, mass, mass effect, hydrocephalus or abnormal intra or extra-axial fluid collections.  There is moderate cerebral and mild cerebellar atrophy with associated ex vacuo dilatation of the ventricular system.  Patchy and confluent regions of decreased attenuation throughout the deep and periventricular white matter of the cerebral hemispheres is most consistent with chronic microvascular ischemic changes of the white matter, and are similar to prior study.  No displaced skull fractures are noted.  Visualized paranasal sinuses and mastoids are well pneumatized.  IMPRESSION: 1.  No acute  intracranial abnormalities. 2.  Moderate cerebral and mild cerebellar atrophy with chronic microvascular ischemic changes in the white matter of the cerebral hemispheres bilaterally, similar to prior examinations.  CT CERVICAL SPINE  Findings: Grade 1 anterolisthesis of C4-C5 is noted.  The chronicity of this finding is uncertain, however, this is favored to be a chronic finding, as there is extensive facet arthropathy throughout the cervical spine, particularly at the C4-C5 level. Multilevel degenerative disc disease is also noted, most  severe at C4-C5, C5-C6 and C6-C7.  Prevertebral soft tissues are normal.  No acute displaced fractures are noted.  IMPRESSION: 1.  Although there is grade 1 anterolisthesis of C4 upon C5, this is favored to be a chronic finding in this patient.  However, clinical correlation is recommended and if there is concern for acute trauma to the cervical spine, evaluation for acute ligamentous injury could best be obtained by MRI of the cervical spine. 2.  Multilevel degenerative disc disease and cervical spondylosis.  Original Report Authenticated By: Florencia Reasons, M.D.   Ct Cervical Spine Wo Contrast 03/10/2011  *RADIOLOGY REPORT*  Clinical Data:  History of fall.  CT HEAD WITHOUT CONTRAST CT CERVICAL SPINE WITHOUT CONTRAST  Technique:  Multidetector CT imaging of the head and cervical spine was performed following the standard protocol without intravenous contrast.  Multiplanar CT image reconstructions of the cervical spine were also generated.  Comparison:  Multiple priors, most recently head CT 10/19/2010.  CT HEAD  Findings: No definite acute intracranial abnormalities. Specifically, no definite evidence of acute infarction, mass, mass effect, hydrocephalus or abnormal intra or extra-axial fluid collections.  There is moderate cerebral and mild cerebellar atrophy with associated ex vacuo dilatation of the ventricular system.  Patchy and confluent regions of decreased attenuation  throughout the deep and periventricular white matter of the cerebral hemispheres is most consistent with chronic microvascular ischemic changes of the white matter, and are similar to prior study.  No displaced skull fractures are noted.  Visualized paranasal sinuses and mastoids are well pneumatized.  IMPRESSION: 1.  No acute intracranial abnormalities. 2.  Moderate cerebral and mild cerebellar atrophy with chronic microvascular ischemic changes in the white matter of the cerebral hemispheres bilaterally, similar to prior examinations.  CT CERVICAL SPINE  Findings: Grade 1 anterolisthesis of C4-C5 is noted.  The chronicity of this finding is uncertain, however, this is favored to be a chronic finding, as there is extensive facet arthropathy throughout the cervical spine, particularly at the C4-C5 level. Multilevel degenerative disc disease is also noted, most severe at C4-C5, C5-C6 and C6-C7.  Prevertebral soft tissues are normal.  No acute displaced fractures are noted.  IMPRESSION: 1.  Although there is grade 1 anterolisthesis of C4 upon C5, this is favored to be a chronic finding in this patient.  However, clinical correlation is recommended and if there is concern for acute trauma to the cervical spine, evaluation for acute ligamentous injury could best be obtained by MRI of the cervical spine. 2.  Multilevel degenerative disc disease and cervical spondylosis.  Original Report Authenticated By: Florencia Reasons, M.D.   Mr Cervical Spine Wo Contrast 03/10/2011  *RADIOLOGY REPORT*  Clinical Data: Fall.  Abnormal CT scan.  MRI CERVICAL SPINE WITHOUT CONTRAST  Technique:  Multiplanar and multiecho pulse sequences of the cervical spine, to include the craniocervical junction and cervicothoracic junction, were obtained according to standard protocol without intravenous contrast.  Comparison: Cervical spine CT same day  Findings: The examination is markedly limited by patient motion. There is ordinary dated  generative change at the C1-2 articulation but no encroachment upon the upper cervical cord.  C2-3 is unremarkable.  C3-4 shows mild bulging of the disc.  There is facet degeneration more on the left.  No neural compression.  C4-5 shows bilateral facet arthropathy with chronic anterolisthesis of 2-3 mm.  There is bulging of the disc.  No compressive effect upon the cord.  There is mild foraminal narrowing bilaterally.  C5-6 shows mild  bulging of the disc.  The canal and foramina are sufficiently patent.  There is some facet degeneration on the left.  C6-7 shows facet degeneration and left more than right with 1 mm of anterolisthesis.  There is shallow protrusion of disc material with a small left posterolateral disc herniation.  No apparent compressive effect upon the cord.  There is foraminal narrowing on the left.  C7-T1 is normal.  No abnormal cord signal.  I do not see any marrow edema or soft tissue edema to suggest acute injury.  This, in combination with the unchanged appearance from multiple previous CT examinations argues against any acute nature.  IMPRESSION: Limited exam by patient motion.  No acute injury suspected. Chronic degenerative changes and slippages as outlined above.  Original Report Authenticated By: Thomasenia Sales, M.D.   Dg Shoulder Left 03/10/2011  *RADIOLOGY REPORT*  Clinical Data: Left shoulder pain secondary to a fall.  Hematoma on the left shoulder and forehead.  LEFT SHOULDER - 2+ VIEW  Comparison: None  Findings: There is a comminuted fracture of the distal clavicle best seen on the transscapular view.  IMPRESSION: Comminuted fracture of the distal left clavicle.  Original Report Authenticated By: Gwynn Burly, M.D.     9:22 PM:    MRI CS ordered after questionable results on CT scan.  Pt is demented, therefore physical exam cannot be relied upon.  MRI without CS fx.  CT-H neg for acute process.  +forehead hematoma and left clavicle fx.  Will tx symptomatically at this time.   Pt confused per her baseline.  Will d/c back to the facility with sling.           Laray Anger, DO 03/12/11 [phone]

## 2011-03-10 NOTE — ED Notes (Signed)
Left in c/o [facility] staff for transport home; instructions reviewed and f/u information provided.

## 2011-03-24 ENCOUNTER — Emergency Department (HOSPITAL_COMMUNITY): Payer: Medicare Other

## 2011-03-24 ENCOUNTER — Encounter (HOSPITAL_COMMUNITY): Payer: Self-pay | Admitting: *Deleted

## 2011-03-24 ENCOUNTER — Emergency Department (HOSPITAL_COMMUNITY)
Admission: EM | Admit: 2011-03-24 | Discharge: 2011-03-24 | Disposition: A | Discharge status: Skilled Nursing Facility | Payer: Medicare Other | Attending: Emergency Medicine | Admitting: Emergency Medicine

## 2011-03-24 DIAGNOSIS — R10819 Abdominal tenderness, unspecified site: Secondary | ICD-10-CM | POA: Insufficient documentation

## 2011-03-24 DIAGNOSIS — Y9210 Unspecified residential institution as the place of occurrence of the external cause: Secondary | ICD-10-CM | POA: Insufficient documentation

## 2011-03-24 DIAGNOSIS — I10 Essential (primary) hypertension: Secondary | ICD-10-CM | POA: Insufficient documentation

## 2011-03-24 DIAGNOSIS — R109 Unspecified abdominal pain: Secondary | ICD-10-CM | POA: Insufficient documentation

## 2011-03-24 DIAGNOSIS — F0390 Unspecified dementia without behavioral disturbance: Secondary | ICD-10-CM | POA: Insufficient documentation

## 2011-03-24 DIAGNOSIS — W19XXXA Unspecified fall, initial encounter: Secondary | ICD-10-CM

## 2011-03-24 HISTORY — DX: Unspecified dementia, unspecified severity, without behavioral disturbance, psychotic disturbance, mood disturbance, and anxiety: F03.90

## 2011-03-24 NOTE — ED Provider Notes (Addendum)
History     CSN: [id number]  Arrival date & time 03/24/11  1656   First MD Initiated Contact with Patient 03/24/11 1742      Chief Complaint  Patient presents with  . Fall    (Consider location/radiation/quality/duration/timing/severity/associated sxs/prior treatment) HPI:  Level V caveat for dementia. Patient allegedly fell in nursing home. She points to right groin as area of pain. No obvious head or neck trauma. No change in behavior or new neurological deficits.  Patient presents to ED on spine board via EMS.  Past Medical History  Diagnosis Date  . Hypertension   . Dementia     No past surgical history on file.  History reviewed. No pertinent family history.  History  Substance Use Topics  . Smoking status: Not on file  . Smokeless tobacco: Not on file  . Alcohol Use: No    OB History    Grav Para Term Preterm Abortions TAB SAB Ect Mult Living                  Review of Systems  Unable to perform ROS: Dementia    Allergies  Penicillins  Home Medications  No current outpatient prescriptions on file.  BP 136/65  Pulse 65  Temp(Src) 97.4 F (36.3 C) (Oral)  Resp 18  SpO2 99%  Physical Exam  Nursing note and vitals reviewed. Constitutional: She is oriented to person, place, and time. She appears well-developed and well-nourished.  HENT:  Head: Normocephalic and atraumatic.  Eyes: Conjunctivae and EOM are normal. Pupils are equal, round, and reactive to light.  Neck: Normal range of motion. Neck supple.  Cardiovascular: Normal rate and regular rhythm.   Pulmonary/Chest: Effort normal and breath sounds normal.  Abdominal: Soft. Bowel sounds are normal.  Musculoskeletal: Normal range of motion.       Tender right groin   Neurological: She is alert and oriented to person, place, and time.  Skin: Skin is warm and dry.  Psychiatric: She has a normal mood and affect.    ED Course  Procedures (including critical care time)  Labs Reviewed - No data  to display No results found.   No diagnosis found.  Dg Pelvis 1-2 Views  03/24/2011  *RADIOLOGY REPORT*  Clinical Data: History of fall. Marland Kitchen  PELVIS - 1-2 VIEW  Comparison: No priors.  Findings: A single AP view of the pelvis demonstrates no displaced fractures of the bony pelvis or visualized proximal femurs.  IMPRESSION:  1.  Negative for acute fracture.  Original Report Authenticated By: Florencia Reasons, M.D.   Ct Cervical Spine Wo Contrast  03/24/2011  *RADIOLOGY REPORT*  Clinical Data:  Fall, history hypertension, dementia  CT CERVICAL SPINE WITHOUT CONTRAST  Technique:  Multidetector CT imaging of the cervical spine was performed. Multiplanar CT image reconstructions were also generated.  Comparison: None.  Findings: Osseous demineralization. Visualized skull base intact. Atherosclerotic calcification of internal carotid arteries at skull base. Scattered motion artifacts, for which repeat imaging was performed. Multilevel facet degenerative changes with mild anterolisthesis at C4-C5 likely degenerative in origin. Disc space narrowing with endplate spur formation at C5-C6 and C6- C7, less C7-T1. Vertebral body heights maintained without fracture or additional subluxation. Prevertebral soft tissues normal thickness. Lung apices clear.  IMPRESSION: Degenerative disc and facet disease changes of the cervical spine as above. No definite acute abnormalities.  Original Report Authenticated By: Lollie Marrow, M.D.   MDM  Patient appears to be normal. Duplicate films of cervical spine and  pelvis. No evidence of new head injury  X-rays of pelvis and C-spine negative.      Donnetta Hutching, MD 03/24/11 1745  Donnetta Hutching, MD 03/24/11 1850

## 2011-03-24 NOTE — ED Notes (Signed)
Report called to [facility].

## 2011-03-24 NOTE — ED Notes (Signed)
Pt taken off backboard and cervical collar by Dr. Adriana Simas. Pt has skin tear noted to her left elbow. Pt has greenish black bruising to the left side of her face and small abrasion on cheek. Pt remains awake, alert and confused. Pt moving all extremities.

## 2011-03-24 NOTE — ED Notes (Signed)
Attempted to contact [facility] to inform of patient's return.  No answer, unable to leave voice mail.  EMS in department to transport patient back to facility.

## 2011-03-24 NOTE — ED Notes (Signed)
Pt fell at [facility]. Denies pain at this time. Pt confused with history of dementia. Pt awake and alert and follows commands.

## 2011-03-24 NOTE — Discharge Instructions (Signed)
X-rays of neck and pelvis were negative. Followup with her primary care Dr.

## 2011-03-24 NOTE — ED Notes (Signed)
Pt disoriented, but easilly redirected.  This is pt's baseline.  Pt denies pain at present time, no distress noted.  Pt up for discharge.  Awaiting [facility] for transport back to facility.

## 2011-05-12 ENCOUNTER — Emergency Department (HOSPITAL_COMMUNITY)
Admission: EM | Admit: 2011-05-12 | Discharge: 2011-05-12 | Disposition: A | Discharge status: Other Acute Inpatient Hospital | Payer: PRIVATE HEALTH INSURANCE | Attending: Emergency Medicine | Admitting: Emergency Medicine

## 2011-05-12 ENCOUNTER — Encounter (HOSPITAL_COMMUNITY): Payer: Self-pay

## 2011-05-12 DIAGNOSIS — I10 Essential (primary) hypertension: Secondary | ICD-10-CM | POA: Insufficient documentation

## 2011-05-12 DIAGNOSIS — M129 Arthropathy, unspecified: Secondary | ICD-10-CM | POA: Insufficient documentation

## 2011-05-12 DIAGNOSIS — E119 Type 2 diabetes mellitus without complications: Secondary | ICD-10-CM | POA: Insufficient documentation

## 2011-05-12 DIAGNOSIS — R001 Bradycardia, unspecified: Secondary | ICD-10-CM

## 2011-05-12 DIAGNOSIS — G309 Alzheimer's disease, unspecified: Secondary | ICD-10-CM | POA: Insufficient documentation

## 2011-05-12 DIAGNOSIS — R231 Pallor: Secondary | ICD-10-CM | POA: Insufficient documentation

## 2011-05-12 DIAGNOSIS — I498 Other specified cardiac arrhythmias: Secondary | ICD-10-CM | POA: Insufficient documentation

## 2011-05-12 DIAGNOSIS — F0280 Dementia in other diseases classified elsewhere without behavioral disturbance: Secondary | ICD-10-CM | POA: Insufficient documentation

## 2011-05-12 DIAGNOSIS — K219 Gastro-esophageal reflux disease without esophagitis: Secondary | ICD-10-CM | POA: Insufficient documentation

## 2011-05-12 DIAGNOSIS — N390 Urinary tract infection, site not specified: Secondary | ICD-10-CM

## 2011-05-12 DIAGNOSIS — Z79899 Other long term (current) drug therapy: Secondary | ICD-10-CM | POA: Insufficient documentation

## 2011-05-12 DIAGNOSIS — R61 Generalized hyperhidrosis: Secondary | ICD-10-CM | POA: Insufficient documentation

## 2011-05-12 LAB — URINALYSIS, ROUTINE W REFLEX MICROSCOPIC
Bilirubin Urine: NEGATIVE
Glucose, UA: NEGATIVE mg/dL
Specific Gravity, Urine: 1.01 (ref 1.005–1.030)
pH: 7 (ref 5.0–8.0)

## 2011-05-12 LAB — POCT I-STAT TROPONIN I: Troponin i, poc: 0.01 ng/mL (ref 0.00–0.08)

## 2011-05-12 LAB — DIFFERENTIAL
Eosinophils Absolute: 0.1 10*3/uL (ref 0.0–0.7)
Lymphocytes Relative: 27 % (ref 12–46)
Lymphs Abs: 1.2 10*3/uL (ref 0.7–4.0)
Monocytes Relative: 9 % (ref 3–12)
Neutro Abs: 2.5 10*3/uL (ref 1.7–7.7)
Neutrophils Relative %: 59 % (ref 43–77)

## 2011-05-12 LAB — CBC
Hemoglobin: 12.2 g/dL (ref 12.0–15.0)
MCH: 31.8 pg (ref 26.0–34.0)
Platelets: 288 10*3/uL (ref 150–400)
RBC: 3.84 MIL/uL — ABNORMAL LOW (ref 3.87–5.11)
WBC: 4.3 10*3/uL (ref 4.0–10.5)

## 2011-05-12 LAB — URINE MICROSCOPIC-ADD ON

## 2011-05-12 LAB — BASIC METABOLIC PANEL
BUN: 15 mg/dL (ref 6–23)
CO2: 29 mEq/L (ref 19–32)
Chloride: 103 mEq/L (ref 96–112)
Glucose, Bld: 111 mg/dL — ABNORMAL HIGH (ref 70–99)
Potassium: 4.1 mEq/L (ref 3.5–5.1)
Sodium: 139 mEq/L (ref 135–145)

## 2011-05-12 MED ORDER — CEPHALEXIN 500 MG PO CAPS: 500.0000 mg | ORAL_CAPSULE | Freq: Four times a day (QID) | ORAL | Status: AC

## 2011-05-12 NOTE — ED Notes (Signed)
EMS reports pt resident at [facility] and says when pt came to dining room she was pale, diaphoretic and clammy.   CBG 158 bp 103 systolic.

## 2011-05-12 NOTE — ED Notes (Signed)
Spoke with [facility] and [place]- will send transportation to take pt back

## 2011-05-12 NOTE — Discharge Instructions (Signed)
PLEASE STOP YOUR METOPROLOL UNTIL FURTHER INSTRUCTED BY YOUR PHYSICIAN DUE TO LOW HEART RATE

## 2011-05-12 NOTE — ED Provider Notes (Signed)
History   This chart was scribed for Joya Gaskins, MD by [name]. The patient was seen in room APA19/APA19. Patient's care was started at 0959.    CSN: [id number]  Arrival date & time 05/12/11  4098   First MD Initiated Contact with Patient 05/12/11 1012      Chief Complaint  Patient presents with  . diaphoretic, pale      HPI Level 5 Caveat applies due to Dementia [name] is a 78 y.o. female who presents to the Emergency Department after being BIB EMS from [facility] nursing facility for evaluation after staff reported finding patient diaphoretic and pale this morning. Patient currently denies HA, blurred vision, visual changes, vomiting, diarrhea, chest pain, SOB, abdominal pain but this is limited due to Dementia. Patient with h/o arthritis, Alzheimer's disease, diabetes, HTN.  Past Medical History  Diagnosis Date  . Arthritis   . GE reflux   . Alzheimer's disease   . Diabetes mellitus   . Hypertension     History reviewed. No pertinent past surgical history.  No family history on file.  History  Substance Use Topics  . Smoking status: Not on file  . Smokeless tobacco: Not on file  . Alcohol Use: No    OB History    Grav Para Term Preterm Abortions TAB SAB Ect Mult Living                  Review of Systems  Unable to perform ROS: Dementia     Allergies  Penicillins  Home Medications   Current Outpatient Rx  Name Route Sig Dispense Refill  . OS-CAL 500 + D PO Oral Take 500 mg by mouth 2 (two) times daily.     . CEPHALEXIN 500 MG PO CAPS Oral Take 500 mg by mouth 2 (two) times daily. Take for 7 days started 09-24-10     . DONEPEZIL HCL 10 MG PO TABS Oral Take 10 mg by mouth at bedtime as needed.      Marland Kitchen DOXYCYCLINE HYCLATE 100 MG PO CAPS Oral Take 100 mg by mouth 2 (two) times daily.      Marland Kitchen VITAMIN D2 PO Oral Take 1.25 mg by mouth once a week. On Fridays of each week    . ERYTHROMYCIN 5 MG/GM OP OINT Right Eye Place 1 application into  the right eye 2 (two) times daily.      Marland Kitchen BACID PO TABS Oral Take 1 tablet by mouth daily.      Marland Kitchen LISINOPRIL 30 MG PO TABS Oral Take 30 mg by mouth daily.      Marland Kitchen LORAZEPAM 0.5 MG PO TABS Oral Take 0.5 mg by mouth every 8 (eight) hours as needed.      Marland Kitchen MAGNESIUM HYDROXIDE 400 MG/5ML PO SUSP Oral Take by mouth daily as needed.      Marland Kitchen MEMANTINE HCL 10 MG PO TABS Oral Take 10 mg by mouth 2 (two) times daily.      Marland Kitchen METOPROLOL SUCCINATE ER 50 MG PO TB24 Oral Take 50 mg by mouth daily.      Marland Kitchen CARNATION INST BREAKFAST JUICE PO Oral Take 1 Can by mouth 3 (three) times daily.      Marland Kitchen PAROXETINE HCL 40 MG PO TABS Oral Take 40 mg by mouth every morning.      Marland Kitchen RISA-BID PROBIOTIC PO Oral Take 1 tablet by mouth daily.      . QUETIAPINE FUMARATE 50 MG PO TABS Oral  Take 50 mg by mouth at bedtime.      Marland Kitchen ZINC OXIDE 20 % EX OINT Topical Apply 1 application topically as needed.     Marland Kitchen ZOLPIDEM TARTRATE ER 12.5 MG PO TBCR Oral Take 5 mg by mouth at bedtime as needed.        BP 117/41  Pulse 52  Temp(Src) 98.1 F (36.7 C) (Oral)  Resp 20  SpO2 96%  Physical Exam CONSTITUTIONAL: Well developed/well nourished HEAD AND FACE: Normocephalic/atraumatic EYES: EOMI/PERRL ENMT: Mucous membranes moist NECK: supple no meningeal signs SPINE:entire spine nontender CV: S1/S2 noted, no murmurs/rubs/gallops noted LUNGS: Lungs are clear to auscultation bilaterally, no apparent distress ABDOMEN: soft, nontender, no rebound or guarding NEURO: Pt is awake/alert, moves all extremitiesx4 EXTREMITIES: pulses normal, full ROM SKIN: warm, color normal PSYCH: no abnormalities of mood noted  ED Course  Procedures  DIAGNOSTIC STUDIES: Oxygen Saturation is 96% on room air, adequate by my interpretation.    COORDINATION OF CARE:  11:02 AM Pt with episodes of alteration in consciousness (though did not fall) now back to baseline Bradycardia noted will stop metoprolol  uti noted and pt has been on keflex in past, will  restart Pt awake/alert, no distress, further workup/imaging deferred  The patient appears reasonably screened and/or stabilized for discharge and I doubt any other medical condition or other [facility] requiring further screening, evaluation, or treatment in the ED at this time prior to discharge.    MDM  Nursing notes reviewed and considered in documentation All labs/vitals reviewed and considered     Date: 05/12/2011  Rate: 50  Rhythm: sinus bradycardia  QRS Axis: normal  Intervals: normal  ST/T Wave abnormalities: nonspecific ST changes  Conduction Disutrbances:none  Narrative Interpretation:   Old EKG Reviewed: changes noted Rate is slower on today's EKG    I personally performed the services described in this documentation, which was scribed in my presence. The recorded information has been reviewed and considered.      Joya Gaskins, MD 05/12/11 1320

## 2011-05-12 NOTE — ED Notes (Signed)
Gave patient crackers and a cola at patient request.

## 2011-05-12 NOTE — ED Notes (Signed)
Spoke with Mia at [facility], per Mia, " pt passed out and started drooling. She was in the dining room sitting in chair. She came back around. Her bp was 138/71. We wanted her to be evaluated"

## 2011-05-12 NOTE — ED Notes (Signed)
Ambulated in hallway with minimal assistance without difficulty.

## 2011-05-18 ENCOUNTER — Encounter (HOSPITAL_COMMUNITY): Payer: Self-pay

## 2011-09-23 ENCOUNTER — Encounter (HOSPITAL_COMMUNITY): Payer: Self-pay | Admitting: Emergency Medicine

## 2011-09-23 ENCOUNTER — Emergency Department (HOSPITAL_COMMUNITY)
Admission: EM | Admit: 2011-09-23 | Discharge: 2011-09-23 | Disposition: A | Discharge status: Home / Self Care | Payer: Medicare Other | Attending: Emergency Medicine | Admitting: Emergency Medicine

## 2011-09-23 ENCOUNTER — Emergency Department (HOSPITAL_COMMUNITY): Payer: Medicare Other

## 2011-09-23 DIAGNOSIS — E119 Type 2 diabetes mellitus without complications: Secondary | ICD-10-CM | POA: Insufficient documentation

## 2011-09-23 DIAGNOSIS — F0280 Dementia in other diseases classified elsewhere without behavioral disturbance: Secondary | ICD-10-CM | POA: Insufficient documentation

## 2011-09-23 DIAGNOSIS — S0990XA Unspecified injury of head, initial encounter: Secondary | ICD-10-CM | POA: Insufficient documentation

## 2011-09-23 DIAGNOSIS — Y9210 Unspecified residential institution as the place of occurrence of the external cause: Secondary | ICD-10-CM | POA: Insufficient documentation

## 2011-09-23 DIAGNOSIS — G309 Alzheimer's disease, unspecified: Secondary | ICD-10-CM | POA: Insufficient documentation

## 2011-09-23 DIAGNOSIS — M47812 Spondylosis without myelopathy or radiculopathy, cervical region: Secondary | ICD-10-CM | POA: Insufficient documentation

## 2011-09-23 DIAGNOSIS — M25569 Pain in unspecified knee: Secondary | ICD-10-CM | POA: Insufficient documentation

## 2011-09-23 DIAGNOSIS — Z79899 Other long term (current) drug therapy: Secondary | ICD-10-CM | POA: Insufficient documentation

## 2011-09-23 DIAGNOSIS — W19XXXA Unspecified fall, initial encounter: Secondary | ICD-10-CM | POA: Insufficient documentation

## 2011-09-23 DIAGNOSIS — T1490XA Injury, unspecified, initial encounter: Secondary | ICD-10-CM | POA: Insufficient documentation

## 2011-09-23 DIAGNOSIS — M25559 Pain in unspecified hip: Secondary | ICD-10-CM | POA: Insufficient documentation

## 2011-09-23 DIAGNOSIS — I10 Essential (primary) hypertension: Secondary | ICD-10-CM | POA: Insufficient documentation

## 2011-09-23 NOTE — ED Notes (Signed)
Per EMS: Pt presents to ED after a fall in grass area at [facility]. Staff states pt was c/o pain in right hip however pt denied with EMS. Pt has hx of Alzheimer. Pt is immobilized for precautions.

## 2011-09-23 NOTE — ED Provider Notes (Signed)
History  This chart was scribed for [name] B. Bernette Mayers, MD by Shari Heritage. The patient was seen in room APA09/APA09. Patient's care was started at 1439.     CSN: [id number]  Arrival date & time 09/23/11  1433   First MD Initiated Contact with Patient 09/23/11 1439      Chief Complaint  Patient presents with  . Fall    The history is provided by the patient and the EMS personnel. The history is limited by the condition of the patient. No language interpreter was used.  Level 5 Caveat - Unable to obtain full HPI due to patient's dementia.  [name] is a 78 y.o. female who presents to the Emergency Department complaining right knee pain and possible hip pain after a fall that occurred 1-2 hours ago. Patient is a resident at [facility]. Patient was transported here by EMS. Patient has a medical history of Alzheimer's disease, dementia, arthritis, GERD, diabetes and HTN.   Past Medical History  Diagnosis Date  . Dementia   . Arthritis   . GE reflux   . Alzheimer's disease   . Diabetes mellitus   . Hypertension     History reviewed. No pertinent past surgical history.  History reviewed. No pertinent family history.  History  Substance Use Topics  . Smoking status: Not on file  . Smokeless tobacco: Not on file  . Alcohol Use: No    OB History    Grav Para Term Preterm Abortions TAB SAB Ect Mult Living                  Review of Systems  Unable to perform ROS: Dementia    Allergies  Penicillins and Penicillins  Home Medications   Current Outpatient Rx  Name Route Sig Dispense Refill  . ACETAMINOPHEN 500 MG PO TABS Oral Take 1,000 mg by mouth every 4 (four) hours as needed. For fever or minor pain    . ALENDRONATE SODIUM 70 MG PO TABS Oral Take 70 mg by mouth every 7 (seven) days. Takes on sundays Take with a full glass of water on an empty stomach.    . ALENDRONATE SODIUM 70 MG PO TABS Oral Take 70 mg by mouth every 7 (seven) days. Take  with a full glass of water on an empty stomach.    . ALUMINUM & MAGNESIUM HYDROXIDE 225-200 MG/5ML PO SUSP Oral Take 30 mLs by mouth every 6 (six) hours as needed. For indigestion or heartburn    . EMETROL 1.87-1.87-21.5 PO SOLN Oral Take 10 mLs by mouth every 30 (thirty) minutes as needed. For nausea  Maximum of 4 doses    . CALCIUM CARBONATE-VITAMIN D 500-200 MG-UNIT PO TABS Oral Take 1 tablet by mouth 2 (two) times daily.    Marland Kitchen CALCIUM CARBONATE-VITAMIN D 500-200 MG-UNIT PO TABS Oral Take 1 tablet by mouth 2 (two) times daily.    Marland Kitchen DIPHENHYDRAMINE HCL 25 MG PO TABS Oral Take 25 mg by mouth every 4 (four) hours as needed. For minor allergic reactions    . DIPHENHYDRAMINE-APAP (SLEEP) 25-500 MG PO TABS Oral Take 1 tablet by mouth at bedtime.    . DONEPEZIL HCL 10 MG PO TABS Oral Take 10 mg by mouth at bedtime.     . DONEPEZIL HCL 10 MG PO TABS Oral Take 10 mg by mouth at bedtime as needed.    . GUAIFENESIN 100 MG/5ML PO SYRP Oral Take 200 mg by mouth 4 (four) times daily  as needed. For cough Maximum of 4 doses    . LISINOPRIL 30 MG PO TABS Oral Take 30 mg by mouth daily. Hold for SBP less than 110    . LISINOPRIL 30 MG PO TABS Oral Take 30 mg by mouth daily.    Marland Kitchen LOPERAMIDE HCL 2 MG PO CAPS Oral Take 2-4 mg by mouth 4 (four) times daily as needed. For diarrhea   maximum of 4 doses    . MAGNESIUM HYDROXIDE 400 MG/5ML PO SUSP Oral Take 30 mLs by mouth 2 (two) times daily as needed. For constipation    . MEMANTINE HCL 10 MG PO TABS Oral Take 10 mg by mouth 2 (two) times daily.      Marland Kitchen MEMANTINE HCL 10 MG PO TABS Oral Take 10 mg by mouth 2 (two) times daily.    Marland Kitchen METOPROLOL TARTRATE 25 MG PO TABS Oral Take 12.5 mg by mouth daily. **Hold for blood pressure levels <110/hr<60    . MIRTAZAPINE 15 MG PO TABS Oral Take 15 mg by mouth at bedtime.    Marland Kitchen MIRTAZAPINE 15 MG PO TABS Oral Take 15 mg by mouth at bedtime.    Marland Kitchen QUETIAPINE FUMARATE 25 MG PO TABS Oral Take 12.5 mg by mouth at bedtime.    Marland Kitchen QUETIAPINE  FUMARATE 25 MG PO TABS Oral Take 25 mg by mouth at bedtime.    . DISPOSABLE ENEMA 19-7 GM/118ML RE ENEM Rectal Place 1 enema rectally daily as needed. For constipation if not relieved by milk of magnesia  follow package directions    . VITAMIN D (ERGOCALCIFEROL) 50000 UNITS PO CAPS Oral Take 50,000 Units by mouth every 7 (seven) days.      BP 141/70  Pulse 68  Temp 98.1 F (36.7 C) (Oral)  Resp 18  SpO2 98%  Physical Exam  Nursing note and vitals reviewed. Constitutional: She is oriented to person, place, and time. She appears well-developed and well-nourished.  HENT:  Head: Normocephalic and atraumatic.  Eyes: EOM are normal. Pupils are equal, round, and reactive to light.  Neck: Normal range of motion. Neck supple.  Cardiovascular: Normal rate, normal heart sounds and intact distal pulses.   Pulmonary/Chest: Effort normal and breath sounds normal.  Abdominal: Bowel sounds are normal. She exhibits no distension. There is no tenderness.  Musculoskeletal: Normal range of motion. She exhibits no edema and no tenderness.       Right hip: She exhibits tenderness.       Left hip: She exhibits tenderness.       Right knee: tenderness found.       Cervical back: She exhibits no tenderness.       Thoracic back: She exhibits no tenderness.       Lumbar back: She exhibits no tenderness.       No spinal tenderness.  Tenderness to right knee.  Tenderness to hips bilaterally.  Neurological: She is alert and oriented to person, place, and time. She has normal strength. No cranial nerve deficit or sensory deficit.  Skin: Skin is warm and dry. No rash noted.  Psychiatric: She has a normal mood and affect.    ED Course  Procedures (including critical care time) DIAGNOSTIC STUDIES: Oxygen Saturation is 98% on room air, normal by my interpretation.    COORDINATION OF CARE: 3:00pm- Patient informed of current plan for treatment and evaluation and agrees with plan at this time.    Labs  Reviewed - No data to display  Dg Hip Bilateral  W/pelvis  09/23/2011  *RADIOLOGY REPORT*  Clinical Data: Larey Seat.  Bilateral hip pain.  BILATERAL HIP WITH PELVIS - 4+ VIEW  Comparison: 05/18/2010.  Findings: Both hips are normally located.  There are mild stable degenerative changes bilaterally.  No acute fracture or evidence of avascular necrosis.  The pubic symphysis and SI joints are intact. No pelvic fractures are seen.  IMPRESSION: No acute bony findings.   Original Report Authenticated By: P. Loralie Champagne, M.D.    Ct Head Wo Contrast  09/23/2011  *RADIOLOGY REPORT*  Clinical Data:  Fall.  Head injury  CT HEAD WITHOUT CONTRAST CT CERVICAL SPINE WITHOUT CONTRAST  Technique:  Multidetector CT imaging of the head and cervical spine was performed following the standard protocol without intravenous contrast.  Multiplanar CT image reconstructions of the cervical spine were also generated.  Comparison:  03/10/2011  CT HEAD  Findings: There is diffuse patchy low density throughout the subcortical and periventricular white matter consistent with chronic small vessel ischemic change.  There is prominence of the sulci and ventricles consistent with brain atrophy. There is no evidence for acute brain infarct, hemorrhage or mass.  The mastoid air cells are clear.  There is mucosal thickening involving the right maxillary sinus and ethmoid air cells.  The skull appears intact.  IMPRESSION:  1.  No acute intracranial abnormalities. 2.  Small vessel ischemic change and brain atrophy.  CT CERVICAL SPINE  Findings: Straightening of the normal cervical lordosis.  There is a first-degree anterolisthesis of C4 on C5.  This is similar to previous exam.  Facet joints appear well-aligned.  Vertebral body heights are maintained.  There is disc space narrowing and ventral endplate spurring.  This is most severe at C5-C6.  The lung apices appear clear.  IMPRESSION:  1.  No acute findings. 2.  Cervical spondylosis. 3.  Chronic grade  1 anterolisthesis of C4 upon C5.   Original Report Authenticated By: Rosealee Albee, M.D.    Ct Cervical Spine Wo Contrast  09/23/2011  *RADIOLOGY REPORT*  Clinical Data:  Fall.  Head injury  CT HEAD WITHOUT CONTRAST CT CERVICAL SPINE WITHOUT CONTRAST  Technique:  Multidetector CT imaging of the head and cervical spine was performed following the standard protocol without intravenous contrast.  Multiplanar CT image reconstructions of the cervical spine were also generated.  Comparison:  03/10/2011  CT HEAD  Findings: There is diffuse patchy low density throughout the subcortical and periventricular white matter consistent with chronic small vessel ischemic change.  There is prominence of the sulci and ventricles consistent with brain atrophy. There is no evidence for acute brain infarct, hemorrhage or mass.  The mastoid air cells are clear.  There is mucosal thickening involving the right maxillary sinus and ethmoid air cells.  The skull appears intact.  IMPRESSION:  1.  No acute intracranial abnormalities. 2.  Small vessel ischemic change and brain atrophy.  CT CERVICAL SPINE  Findings: Straightening of the normal cervical lordosis.  There is a first-degree anterolisthesis of C4 on C5.  This is similar to previous exam.  Facet joints appear well-aligned.  Vertebral body heights are maintained.  There is disc space narrowing and ventral endplate spurring.  This is most severe at C5-C6.  The lung apices appear clear.  IMPRESSION:  1.  No acute findings. 2.  Cervical spondylosis. 3.  Chronic grade 1 anterolisthesis of C4 upon C5.   Original Report Authenticated By: Rosealee Albee, M.D.    Dg Knee Complete 4 Views Left  09/23/2011  *RADIOLOGY REPORT*  Clinical Data: Larey Seat.  Left knee pain.  LEFT KNEE - COMPLETE 4+ VIEW  Comparison: None  Findings: There are mild degenerative changes most notable in the medial compartment with early joint space narrowing and osteophytic spurring.  No acute fracture or  osteochondral abnormality.  No joint effusion.  IMPRESSION: Mild degenerative changes. No acute fracture.   Original Report Authenticated By: P. Loralie Champagne, M.D.    Dg Knee Complete 4 Views Right  09/23/2011  *RADIOLOGY REPORT*  Clinical Data: Larey Seat.  Injured knee.  RIGHT KNEE - COMPLETE 4+ VIEW  Comparison: 10/19/2010.  Findings: There is a medial compartment knee prosthesis which appears stable.  No acute fracture or osteochondral abnormality.  A small joint effusion is noted.  IMPRESSION: No acute bony findings.  Small joint effusion.   Original Report Authenticated By: P. Loralie Champagne, M.D.      No diagnosis found.    MDM  Reported mechanical fall. Inconsistent exam with regards to tenderness, but no bruising or deformity and neg imaging as above. C-collar removed. Return to nursing home     I personally performed the services described in the documentation, which were scribed in my presence. The recorded information has been reviewed and considered.     Donyale Berthold B. Bernette Mayers, MD 09/23/11 [phone]

## 2011-10-13 ENCOUNTER — Encounter (HOSPITAL_COMMUNITY): Payer: Self-pay | Admitting: Emergency Medicine

## 2011-10-13 ENCOUNTER — Emergency Department (HOSPITAL_COMMUNITY): Payer: Medicare Other

## 2011-10-13 ENCOUNTER — Emergency Department (HOSPITAL_COMMUNITY)
Admission: EM | Admit: 2011-10-13 | Discharge: 2011-10-13 | Disposition: A | Discharge status: Home / Self Care | Payer: Medicare Other | Attending: Emergency Medicine | Admitting: Emergency Medicine

## 2011-10-13 DIAGNOSIS — M5030 Other cervical disc degeneration, unspecified cervical region: Secondary | ICD-10-CM | POA: Insufficient documentation

## 2011-10-13 DIAGNOSIS — T1490XA Injury, unspecified, initial encounter: Secondary | ICD-10-CM | POA: Insufficient documentation

## 2011-10-13 DIAGNOSIS — R51 Headache: Secondary | ICD-10-CM | POA: Insufficient documentation

## 2011-10-13 DIAGNOSIS — Z79899 Other long term (current) drug therapy: Secondary | ICD-10-CM | POA: Insufficient documentation

## 2011-10-13 DIAGNOSIS — G309 Alzheimer's disease, unspecified: Secondary | ICD-10-CM | POA: Insufficient documentation

## 2011-10-13 DIAGNOSIS — F0280 Dementia in other diseases classified elsewhere without behavioral disturbance: Secondary | ICD-10-CM | POA: Insufficient documentation

## 2011-10-13 DIAGNOSIS — E119 Type 2 diabetes mellitus without complications: Secondary | ICD-10-CM | POA: Insufficient documentation

## 2011-10-13 DIAGNOSIS — I498 Other specified cardiac arrhythmias: Secondary | ICD-10-CM | POA: Insufficient documentation

## 2011-10-13 DIAGNOSIS — Y9210 Unspecified residential institution as the place of occurrence of the external cause: Secondary | ICD-10-CM | POA: Insufficient documentation

## 2011-10-13 DIAGNOSIS — R001 Bradycardia, unspecified: Secondary | ICD-10-CM

## 2011-10-13 DIAGNOSIS — W19XXXA Unspecified fall, initial encounter: Secondary | ICD-10-CM | POA: Insufficient documentation

## 2011-10-13 DIAGNOSIS — I10 Essential (primary) hypertension: Secondary | ICD-10-CM | POA: Insufficient documentation

## 2011-10-13 NOTE — Discharge Instructions (Signed)
Bradycardia There is no evidence of injury from her fall. Your heart rate is slow. Stop taking metoprolol followup with Dr. Leanord Hawking in 2 days. Return to the ED if you develop new or worsening symptoms. Bradycardia is a term for a heart rate (pulse) that, in adults, is slower than 60 beats per minute. A normal rate is 60 to 100 beats per minute. A heart rate below 60 beats per minute may be normal for some adults with healthy hearts. If the rate is too slow, the heart may have trouble pumping the volume of blood the body needs. If the heart rate gets too low, blood flow to the brain may be decreased and may make you feel lightheaded, dizzy, or faint. The heart has a natural pacemaker in the top of the heart called the SA node (sinoatrial or sinus node). This pacemaker sends out regular electrical signals to the muscle of the heart, telling the heart muscle when to beat (contract). The electrical signal travels from the upper parts of the heart (atria) through the AV node (atrioventricular node), to the lower chambers of the heart (ventricles). The ventricles squeeze, pumping the blood from your heart to your lungs and to the rest of your body. CAUSES   Problem with the heart's electrical system.  Problem with the heart's natural pacemaker.  Heart disease, damage, or infection.  Medications.  Problems with minerals and salts (electrolytes). SYMPTOMS   Fainting (syncope).  Fatigue and weakness.  Shortness of breath (dyspnea).  Chest pain (angina).  Drowsiness.  Confusion. DIAGNOSIS   An electrocardiogram (ECG) can help your caregiver determine the type of slow heart rate you have.  If the cause is not seen on an ECG, you may need to wear a heart monitor that records your heart rhythm for several hours or days.  Blood tests. TREATMENT   Electrolyte supplements.  Medications.  Withholding medication which is causing a slow heart rate.  Pacemaker placement. SEEK IMMEDIATE MEDICAL  CARE IF:   You feel lightheaded or faint.  You develop an irregular heart rate.  You feel chest pain or have trouble breathing. MAKE SURE YOU:   Understand these instructions.  Will watch your condition.  Will get help right away if you are not doing well or get worse. Document Released: 09/13/2001 Document Revised: 03/16/2011 Document Reviewed: 08/10/2007 [facility] Patient Information 2013 [place], [place].

## 2011-10-13 NOTE — ED Provider Notes (Addendum)
History  This chart was scribed for [name], MD by Ladona Ridgel Day. This patient was seen in room APA12/APA12 and the patient's care was started at 1528.  Level 5 caveat to pt's dementia  CSN: [id number]  Arrival date & time 10/13/11  1528   First MD Initiated Contact with Patient 10/13/11 1534      Chief Complaint  Patient presents with  . Fall   The history is provided by the patient. The history is limited by the condition of the patient (dementia). No language interpreter was used.  [name] is a 78 y.o. female who presents to the Emergency Department complaining of an un witnessed fall today after she was found on the floor by staff that heard her fall in a waiting room. She states she has no memory of what happened and EMS states she initially complained of a HA. She has no other complaints.     Past Medical History  Diagnosis Date  . Dementia   . Arthritis   . GE reflux   . Alzheimer's disease   . Diabetes mellitus   . Hypertension     History reviewed. No pertinent past surgical history.  No family history on file.  History  Substance Use Topics  . Smoking status: Not on file  . Smokeless tobacco: Not on file  . Alcohol Use: No    OB History    Grav Para Term Preterm Abortions TAB SAB Ect Mult Living                  Review of Systems A complete 10 system review of systems was obtained and all systems are negative except as noted in the HPI and PMH.   Allergies  Penicillins and Penicillins  Home Medications   Current Outpatient Rx  Name Route Sig Dispense Refill  . ACETAMINOPHEN 500 MG PO TABS Oral Take 1,000 mg by mouth every 4 (four) hours as needed. For fever or minor pain    . ALENDRONATE SODIUM 70 MG PO TABS Oral Take 70 mg by mouth every 7 (seven) days. Takes on sundays Take with a full glass of water on an empty stomach.    . ALENDRONATE SODIUM 70 MG PO TABS Oral Take 70 mg by mouth every 7 (seven) days. Take with a full glass of  water on an empty stomach.    . ALUMINUM & MAGNESIUM HYDROXIDE 225-200 MG/5ML PO SUSP Oral Take 30 mLs by mouth every 6 (six) hours as needed. For indigestion or heartburn    . EMETROL 1.87-1.87-21.5 PO SOLN Oral Take 10 mLs by mouth every 30 (thirty) minutes as needed. For nausea  Maximum of 4 doses    . CALCIUM CARBONATE-VITAMIN D 500-200 MG-UNIT PO TABS Oral Take 1 tablet by mouth 2 (two) times daily.    Marland Kitchen CALCIUM CARBONATE-VITAMIN D 500-200 MG-UNIT PO TABS Oral Take 1 tablet by mouth 2 (two) times daily.    Marland Kitchen DIPHENHYDRAMINE HCL 25 MG PO TABS Oral Take 25 mg by mouth every 4 (four) hours as needed. For minor allergic reactions    . DIPHENHYDRAMINE-APAP (SLEEP) 25-500 MG PO TABS Oral Take 1 tablet by mouth at bedtime.    . DONEPEZIL HCL 10 MG PO TABS Oral Take 10 mg by mouth at bedtime.     . DONEPEZIL HCL 10 MG PO TABS Oral Take 10 mg by mouth at bedtime as needed.    . GUAIFENESIN 100 MG/5ML PO SYRP Oral Take 200 mg by  mouth 4 (four) times daily as needed. For cough Maximum of 4 doses    . LISINOPRIL 30 MG PO TABS Oral Take 30 mg by mouth daily. Hold for SBP less than 110    . LISINOPRIL 30 MG PO TABS Oral Take 30 mg by mouth daily.    Marland Kitchen LOPERAMIDE HCL 2 MG PO CAPS Oral Take 2-4 mg by mouth 4 (four) times daily as needed. For diarrhea   maximum of 4 doses    . MAGNESIUM HYDROXIDE 400 MG/5ML PO SUSP Oral Take 30 mLs by mouth 2 (two) times daily as needed. For constipation    . MEMANTINE HCL 10 MG PO TABS Oral Take 10 mg by mouth 2 (two) times daily.      Marland Kitchen MEMANTINE HCL 10 MG PO TABS Oral Take 10 mg by mouth 2 (two) times daily.    Marland Kitchen METOPROLOL TARTRATE 25 MG PO TABS Oral Take 12.5 mg by mouth daily. **Hold for blood pressure levels <110/hr<60    . MIRTAZAPINE 15 MG PO TABS Oral Take 15 mg by mouth at bedtime.    Marland Kitchen MIRTAZAPINE 15 MG PO TABS Oral Take 15 mg by mouth at bedtime.    Marland Kitchen QUETIAPINE FUMARATE 25 MG PO TABS Oral Take 12.5 mg by mouth at bedtime.    Marland Kitchen QUETIAPINE FUMARATE 25 MG PO  TABS Oral Take 25 mg by mouth at bedtime.    . DISPOSABLE ENEMA 19-7 GM/118ML RE ENEM Rectal Place 1 enema rectally daily as needed. For constipation if not relieved by milk of magnesia  follow package directions    . VITAMIN D (ERGOCALCIFEROL) 50000 UNITS PO CAPS Oral Take 50,000 Units by mouth every 7 (seven) days.      Triage Vitals: BP 142/70  Pulse 50  Temp 97.6 F (36.4 C)  Resp 18  SpO2 97%  Physical Exam  Nursing note and vitals reviewed. Constitutional: She is oriented to person, place, and time. She appears well-developed and well-nourished. No distress.  HENT:  Head: Normocephalic and atraumatic.  Eyes: Conjunctivae normal and EOM are normal. Pupils are equal, round, and reactive to light.  Neck: Neck supple. No tracheal deviation present.       No C-spine tenderness  Cardiovascular: Normal rate, regular rhythm and normal heart sounds.   Pulmonary/Chest: Effort normal and breath sounds normal. No respiratory distress. She has no wheezes. She has no rales. She exhibits no tenderness.  Abdominal: Soft. Bowel sounds are normal. She exhibits no distension. There is no tenderness.  Musculoskeletal: Normal range of motion. She exhibits no tenderness.       No T or L spine tenderness, no step offs or palpable deformities. Full ROM of hips and knees without pain. Pelvis stable.   Neurological: She is alert and oriented to person, place, and time.       5/5 strength throughout  Skin: Skin is warm and dry.  Psychiatric: She has a normal mood and affect. Her behavior is normal.    ED Course  Procedures (including critical care time) DIAGNOSTIC STUDIES: Oxygen Saturation is 97% on room air, normal by my interpretation.    COORDINATION OF CARE: At 340 PM Discussed treatment plan with patient which includes head, neck CT, CXR and pelvis X-ray. Patient agrees.   Labs Reviewed - No data to display Dg Chest 1 View  10/13/2011  *RADIOLOGY REPORT*  Clinical Data: Fall  CHEST - 1  VIEW  Comparison: 03/10/2011, chest CT 02/11/2005  Findings: Heart size remains enlarged.  No evidence for overt edema.  The lungs are grossly clear.  Moderate hiatal hernia again noted. No pleural effusion.  No pneumothorax.  Remote right posterior seventh, eighth, ninth rib fractures are again noted. Allowing for technique, and no new acute osseous abnormality.  IMPRESSION: No acute cardiopulmonary process.  Cardiomegaly without acute edema.   Original Report Authenticated By: Harrel Lemon, M.D.    Dg Pelvis 1-2 Views  10/13/2011  *RADIOLOGY REPORT*  Clinical Data: Fall  PELVIS - 1-2 VIEW  Comparison: None.  Findings: Pelvic bones are intact with no evidence of fracture.  IMPRESSION: No fracture   Original Report Authenticated By: Otilio Carpen, M.D.    Ct Head Wo Contrast  10/13/2011  *RADIOLOGY REPORT*  Clinical Data:  Fall.  Dementia.  CT HEAD WITHOUT CONTRAST CT CERVICAL SPINE WITHOUT CONTRAST  Technique:  Multidetector CT imaging of the head and cervical spine was performed following the standard protocol without intravenous contrast.  Multiplanar CT image reconstructions of the cervical spine were also generated.  Comparison:  CT 09/23/2011  CT HEAD  Findings: Generalized atrophy of a moderate to advanced degree. Chronic white matter hypodensity is unchanged.  Small chronic infarct left thalamus.  Negative for acute infarct.  Negative for intracranial hemorrhage or mass lesion.  Negative for skull fracture.  IMPRESSION: Generalized atrophy and chronic microvascular ischemia.  No acute abnormality.  CT CERVICAL SPINE  Findings: Anterior slip at C3-4, C4-5, C5-6, and C6-7 is unchanged from prior studies.  This appears to be associated with disc and facet degeneration.  No fracture is identified.  No acute bony abnormality.  No change from prior studies.  IMPRESSION: Cervical degenerative changes.  No fracture.  No change from prior studies.   Original Report Authenticated By: Camelia Phenes,  M.D.    Ct Cervical Spine Wo Contrast  10/13/2011  *RADIOLOGY REPORT*  Clinical Data:  Fall.  Dementia.  CT HEAD WITHOUT CONTRAST CT CERVICAL SPINE WITHOUT CONTRAST  Technique:  Multidetector CT imaging of the head and cervical spine was performed following the standard protocol without intravenous contrast.  Multiplanar CT image reconstructions of the cervical spine were also generated.  Comparison:  CT 09/23/2011  CT HEAD  Findings: Generalized atrophy of a moderate to advanced degree. Chronic white matter hypodensity is unchanged.  Small chronic infarct left thalamus.  Negative for acute infarct.  Negative for intracranial hemorrhage or mass lesion.  Negative for skull fracture.  IMPRESSION: Generalized atrophy and chronic microvascular ischemia.  No acute abnormality.  CT CERVICAL SPINE  Findings: Anterior slip at C3-4, C4-5, C5-6, and C6-7 is unchanged from prior studies.  This appears to be associated with disc and facet degeneration.  No fracture is identified.  No acute bony abnormality.  No change from prior studies.  IMPRESSION: Cervical degenerative changes.  No fracture.  No change from prior studies.   Original Report Authenticated By: Camelia Phenes, M.D.      1. Fall   2. Bradycardia       MDM  From nursing home with apparent mechanical fall that was unwitnessed. Staff heard her scream. No loss of consciousness. Initially complained of headache but no pain now. Denies complaints. History of Alzheimer's.  Sinus bradycardia on EKG. BP stable. Denies dizziness, lightheadedness, syncope. Will stop metoprolol.  Xrays negative for acute traumatic injury.  Patient denies complaints and appears to be at her baseline.  Ambulating in hallways. Stable for discharge back to nursing home.   Date: 10/13/2011  Rate: 48  Rhythm: sinus bradycardia  QRS Axis: normal  Intervals: normal  ST/T Wave abnormalities: normal  Conduction Disutrbances:none  Narrative Interpretation:   Old EKG  Reviewed: unchanged  I personally performed the services described in this documentation, which was scribed in my presence.  The recorded information has been reviewed and considered.          [name], MD 10/13/11 1737  [name], MD 10/13/11 4540

## 2011-10-13 NOTE — ED Notes (Signed)
Pt ambulated to and from restroom with minimal assist.  Pt wanted to get out of bed.  Pt sitting up in recliner,

## 2011-10-13 NOTE — ED Notes (Signed)
Pt fell at [place], unwitnessed but staff heard her fall and her scream.  EMS says pt initially c/o headache.  Denies any LOC.

## 2011-11-19 ENCOUNTER — Encounter (HOSPITAL_COMMUNITY): Payer: Self-pay

## 2011-11-19 ENCOUNTER — Emergency Department (HOSPITAL_COMMUNITY): Payer: Medicare Other

## 2011-11-19 ENCOUNTER — Emergency Department (HOSPITAL_COMMUNITY)
Admission: EM | Admit: 2011-11-19 | Discharge: 2011-11-19 | Disposition: A | Discharge status: Skilled Nursing Facility | Payer: Medicare Other | Attending: Emergency Medicine | Admitting: Emergency Medicine

## 2011-11-19 DIAGNOSIS — F0390 Unspecified dementia without behavioral disturbance: Secondary | ICD-10-CM | POA: Insufficient documentation

## 2011-11-19 DIAGNOSIS — W19XXXA Unspecified fall, initial encounter: Secondary | ICD-10-CM

## 2011-11-19 DIAGNOSIS — K219 Gastro-esophageal reflux disease without esophagitis: Secondary | ICD-10-CM | POA: Insufficient documentation

## 2011-11-19 DIAGNOSIS — Y939 Activity, unspecified: Secondary | ICD-10-CM | POA: Insufficient documentation

## 2011-11-19 DIAGNOSIS — E119 Type 2 diabetes mellitus without complications: Secondary | ICD-10-CM | POA: Insufficient documentation

## 2011-11-19 DIAGNOSIS — I10 Essential (primary) hypertension: Secondary | ICD-10-CM | POA: Insufficient documentation

## 2011-11-19 DIAGNOSIS — G309 Alzheimer's disease, unspecified: Secondary | ICD-10-CM | POA: Insufficient documentation

## 2011-11-19 DIAGNOSIS — Z79899 Other long term (current) drug therapy: Secondary | ICD-10-CM | POA: Insufficient documentation

## 2011-11-19 DIAGNOSIS — F0280 Dementia in other diseases classified elsewhere without behavioral disturbance: Secondary | ICD-10-CM | POA: Insufficient documentation

## 2011-11-19 DIAGNOSIS — M129 Arthropathy, unspecified: Secondary | ICD-10-CM | POA: Insufficient documentation

## 2011-11-19 DIAGNOSIS — Y9210 Unspecified residential institution as the place of occurrence of the external cause: Secondary | ICD-10-CM | POA: Insufficient documentation

## 2011-11-19 DIAGNOSIS — W07XXXA Fall from chair, initial encounter: Secondary | ICD-10-CM | POA: Insufficient documentation

## 2011-11-19 NOTE — ED Notes (Signed)
Patient with no complaints at this time. Respirations even and unlabored. Skin warm/dry. Discharge instructions reviewed withcaregiver at this time. given opportunity to voice concerns/ask questions. Patient discharged at this time and left Emergency Department via w/c

## 2011-11-19 NOTE — ED Notes (Signed)
Per, EMS. Pt pick up at [facility]. Sent here for eval of fall. Pt was sitting in dinning room chair and sled out of chair. Pt full immobilized. No obvious injury

## 2011-11-19 NOTE — ED Notes (Signed)
Pt placed on monitor.  

## 2011-11-19 NOTE — ED Notes (Signed)
Nonverbal except to say "Ohh, I hurt".  Does not respond to any questions.  Cleaned of large amount of soft stool.  EKG done.

## 2011-11-19 NOTE — ED Provider Notes (Signed)
History  This chart was scribed for Carleene Cooper III, MD by Ardeen Jourdain, ED Scribe. This patient was seen in room APA01/APA01 and the patient's care was started at 0926.  Level 5 Caveat: Dementia   CSN: [id number]  Arrival date & time 11/19/11  0919   First MD Initiated Contact with Patient 11/19/11 [phone]      Chief Complaint  Patient presents with  . Fall    The history is provided by the patient. No language interpreter was used.    [name] is a 78 y.o. female brought in by ambulance, who presents to the Emergency Department complaining of a fall. She is a pt at [employer]. She was sitting in the dining room when she slid out of her chair. Pt unresponsive to Dr. Jacques Navy questions.    Past Medical History  Diagnosis Date  . Dementia   . Arthritis   . GE reflux   . Alzheimer's disease   . Diabetes mellitus   . Hypertension     History reviewed. No pertinent past surgical history.  No family history on file.  History  Substance Use Topics  . Smoking status: Not on file  . Smokeless tobacco: Not on file  . Alcohol Use: No   No OB history available.   Review of Systems  Unable to perform ROS: Dementia    Allergies  Penicillins and Penicillins  Home Medications   Current Outpatient Rx  Name  Route  Sig  Dispense  Refill  . ACETAMINOPHEN 500 MG PO TABS   Oral   Take 1,000 mg by mouth every 4 (four) hours as needed. For fever or minor pain         . ALENDRONATE SODIUM 70 MG PO TABS   Oral   Take 70 mg by mouth every 7 (seven) days. Takes on sundays Take with a full glass of water on an empty stomach.         . ALENDRONATE SODIUM 70 MG PO TABS   Oral   Take 70 mg by mouth every 7 (seven) days. Take with a full glass of water on an empty stomach.         . ALUMINUM & MAGNESIUM HYDROXIDE 225-200 MG/5ML PO SUSP   Oral   Take 30 mLs by mouth every 6 (six) hours as needed. For indigestion or heartburn         . EMETROL  1.87-1.87-21.5 PO SOLN   Oral   Take 10 mLs by mouth every 30 (thirty) minutes as needed. For nausea  Maximum of 4 doses         . CALCIUM CARBONATE-VITAMIN D 500-200 MG-UNIT PO TABS   Oral   Take 1 tablet by mouth 2 (two) times daily.         Marland Kitchen CALCIUM CARBONATE-VITAMIN D 500-200 MG-UNIT PO TABS   Oral   Take 1 tablet by mouth 2 (two) times daily.         Marland Kitchen DIPHENHYDRAMINE HCL 25 MG PO TABS   Oral   Take 25 mg by mouth every 4 (four) hours as needed. For minor allergic reactions         . DIPHENHYDRAMINE-APAP (SLEEP) 25-500 MG PO TABS   Oral   Take 1 tablet by mouth at bedtime.         . DONEPEZIL HCL 10 MG PO TABS   Oral   Take 10 mg by mouth at bedtime.          Marland Kitchen  DONEPEZIL HCL 10 MG PO TABS   Oral   Take 10 mg by mouth at bedtime as needed.         . GUAIFENESIN 100 MG/5ML PO SYRP   Oral   Take 200 mg by mouth 4 (four) times daily as needed. For cough Maximum of 4 doses         . LISINOPRIL 30 MG PO TABS   Oral   Take 30 mg by mouth daily. Hold for SBP less than 110         . LISINOPRIL 30 MG PO TABS   Oral   Take 30 mg by mouth daily.         Marland Kitchen LOPERAMIDE HCL 2 MG PO CAPS   Oral   Take 2-4 mg by mouth 4 (four) times daily as needed. For diarrhea   maximum of 4 doses         . MAGNESIUM HYDROXIDE 400 MG/5ML PO SUSP   Oral   Take 30 mLs by mouth 2 (two) times daily as needed. For constipation         . MEMANTINE HCL 10 MG PO TABS   Oral   Take 10 mg by mouth 2 (two) times daily.           Marland Kitchen MEMANTINE HCL 10 MG PO TABS   Oral   Take 10 mg by mouth 2 (two) times daily.         Marland Kitchen METOPROLOL TARTRATE 25 MG PO TABS   Oral   Take 12.5 mg by mouth daily. **Hold for blood pressure levels <110/hr<60         . MIRTAZAPINE 15 MG PO TABS   Oral   Take 15 mg by mouth at bedtime.         Marland Kitchen MIRTAZAPINE 15 MG PO TABS   Oral   Take 15 mg by mouth at bedtime.         Marland Kitchen QUETIAPINE FUMARATE 25 MG PO TABS   Oral   Take 12.5 mg  by mouth at bedtime.         Marland Kitchen QUETIAPINE FUMARATE 25 MG PO TABS   Oral   Take 25 mg by mouth at bedtime.         . DISPOSABLE ENEMA 19-7 GM/118ML RE ENEM   Rectal   Place 1 enema rectally daily as needed. For constipation if not relieved by milk of magnesia  follow package directions         . VITAMIN D (ERGOCALCIFEROL) 50000 UNITS PO CAPS   Oral   Take 50,000 Units by mouth every 7 (seven) days.           Triage Vitals: BP 130/77  Pulse 64  Resp 18  Ht 5\' 5"  (1.651 m)  Wt 165 lb (74.844 kg)  BMI 27.46 kg/m2  SpO2 99%  Physical Exam  Nursing note and vitals reviewed. Constitutional: She appears well-developed and well-nourished. No distress.  HENT:  Head: Normocephalic and atraumatic.  Right Ear: External ear normal.  Left Ear: External ear normal.  Nose: Nose normal.  Mouth/Throat: Oropharynx is clear and moist. No oropharyngeal exudate.  Eyes: Conjunctivae normal and EOM are normal. Pupils are equal, round, and reactive to light.  Neck: Normal range of motion. Neck supple. No tracheal deviation present.  Cardiovascular: Normal rate, regular rhythm and normal heart sounds.   Pulmonary/Chest: Effort normal and breath sounds normal. No respiratory distress. She exhibits no tenderness.  Abdominal: Soft. Bowel  sounds are normal. She exhibits no distension. There is no tenderness.  Musculoskeletal: Normal range of motion. She exhibits no edema.       Extremities intact, Tenderness to ROM in right hip  Lymphadenopathy:    She has no cervical adenopathy.  Neurological:       Doesn't respond to conversation.   Skin: Skin is warm and dry.    ED Course  Procedures (including critical care time)  DIAGNOSTIC STUDIES: Oxygen Saturation is 99% on room air, normal by my interpretation.    COORDINATION OF CARE:  9:45 AM: Discussed treatment plan which includes an x-ray of the right hip with pt at bedside and pt agreed to plan.   Date: 11/19/2011  Rate: 70   Rhythm: normal sinus rhythm and premature atrial contractions (PAC)  QRS Axis: normal  Intervals: normal QRS:  Poor R wave progression in precordial leads suggests old anterior myocardial infarction.  ST/T Wave abnormalities: normal  Conduction Disutrbances:none  Narrative Interpretation: Abnormal EKG  Old EKG Reviewed: changes noted--Rate more rapid than on tracing of 10/13/2011, otherwise no change.    Dg Chest 1 View  11/19/2011  *RADIOLOGY REPORT*  Clinical Data: Fall at nursing home.  Pain in right hip.  CHEST - 1 VIEW  Comparison: 10/13/2011  Findings: Retrocardiac density is most compatible with hiatal hernia.  There is also left retrocardiac density compatible with the patient's known Bochdalek hernia which has been shown to contain adipose tissue on prior cross-sectional imaging.  Atherosclerotic calcification in the aortic arch is noted.  No pneumothorax or pleural effusion identified although the left costophrenic angle is excluded.  The upper portion of levoconvex lumbar scoliosis with rotary component observed.  IMPRESSION:  1.  Stable cardiomegaly without edema. 2.  Retrocardiac density is compatible with hiatal hernia.  There is also a left retrocardiac density reflecting the Bochdalek hernia. 3.  Atherosclerosis.   Original Report Authenticated By: Gaylyn Rong, M.D.    Dg Hip Complete Right  11/19/2011  *RADIOLOGY REPORT*  Clinical Data: Fall in nursing home.  Right hip pain.  RIGHT HIP - COMPLETE 2+ VIEW  Comparison: 10/19/2010  Findings: Mild degenerative spurring of the right femoral head noted with axial loss of articular space in the right hip.  No ilioischial or iliopectineal line disruption.  No definite pubic bone fracture.  Sacral arcuate lines appear intact.  No sacroiliac joint dye stasis.  Lower lumbar spondylosis observed.  No iliac bone avulsion or fracture observed.  IMPRESSION:  1.  No fracture on the right hip is radiographically apparent.  No definite pelvic  fracture.  If there is a high index of suspicion of occult fracture, MRI of the hip would be the modality of choice; alternatively, CT of the bony pelvis could be employed. 2.  Lower lumbar spondylosis. 3.  Mild axial loss of articular space in the right hip, likely degenerative.   Original Report Authenticated By: Gaylyn Rong, M.D.    11:29 AM X-rays negative, MRI of right hip recommended by radiologist to check for occult fracture.  4:22 PM MRI of right hip was negative. Will release back to her facility.  1. Fall   2. Dementia     I personally performed the services described in this documentation, which was scribed in my presence. The recorded information has been reviewed and is accurate.  Osvaldo Human, MD         Carleene Cooper III, MD 11/19/11 [phone]

## 2012-10-14 ENCOUNTER — Emergency Department (HOSPITAL_COMMUNITY): Payer: Medicare Other

## 2012-10-14 ENCOUNTER — Encounter (HOSPITAL_COMMUNITY): Payer: Self-pay | Admitting: Emergency Medicine

## 2012-10-14 ENCOUNTER — Emergency Department (HOSPITAL_COMMUNITY)
Admission: EM | Admit: 2012-10-14 | Discharge: 2012-10-14 | Disposition: A | Discharge status: Home / Self Care | Payer: Medicare Other | Attending: Emergency Medicine | Admitting: Emergency Medicine

## 2012-10-14 DIAGNOSIS — E119 Type 2 diabetes mellitus without complications: Secondary | ICD-10-CM | POA: Insufficient documentation

## 2012-10-14 DIAGNOSIS — Y939 Activity, unspecified: Secondary | ICD-10-CM | POA: Insufficient documentation

## 2012-10-14 DIAGNOSIS — Z043 Encounter for examination and observation following other accident: Secondary | ICD-10-CM | POA: Insufficient documentation

## 2012-10-14 DIAGNOSIS — Y9210 Unspecified residential institution as the place of occurrence of the external cause: Secondary | ICD-10-CM | POA: Insufficient documentation

## 2012-10-14 DIAGNOSIS — Z880 Allergy status to penicillin: Secondary | ICD-10-CM | POA: Insufficient documentation

## 2012-10-14 DIAGNOSIS — W19XXXA Unspecified fall, initial encounter: Secondary | ICD-10-CM

## 2012-10-14 DIAGNOSIS — F0390 Unspecified dementia without behavioral disturbance: Secondary | ICD-10-CM

## 2012-10-14 DIAGNOSIS — F0280 Dementia in other diseases classified elsewhere without behavioral disturbance: Secondary | ICD-10-CM | POA: Insufficient documentation

## 2012-10-14 DIAGNOSIS — Z8719 Personal history of other diseases of the digestive system: Secondary | ICD-10-CM | POA: Insufficient documentation

## 2012-10-14 DIAGNOSIS — G309 Alzheimer's disease, unspecified: Secondary | ICD-10-CM | POA: Insufficient documentation

## 2012-10-14 DIAGNOSIS — Z8739 Personal history of other diseases of the musculoskeletal system and connective tissue: Secondary | ICD-10-CM | POA: Insufficient documentation

## 2012-10-14 DIAGNOSIS — R296 Repeated falls: Secondary | ICD-10-CM | POA: Insufficient documentation

## 2012-10-14 DIAGNOSIS — Z79899 Other long term (current) drug therapy: Secondary | ICD-10-CM | POA: Insufficient documentation

## 2012-10-14 DIAGNOSIS — I10 Essential (primary) hypertension: Secondary | ICD-10-CM | POA: Insufficient documentation

## 2012-10-14 LAB — BASIC METABOLIC PANEL
BUN: 17 mg/dL (ref 6–23)
CO2: 30 mEq/L (ref 19–32)
Chloride: 103 mEq/L (ref 96–112)
Creatinine, Ser: 0.87 mg/dL (ref 0.50–1.10)
Glucose, Bld: 107 mg/dL — ABNORMAL HIGH (ref 70–99)
Potassium: 3.6 mEq/L (ref 3.5–5.1)

## 2012-10-14 LAB — CBC WITH DIFFERENTIAL/PLATELET
Eosinophils Relative: 1 % (ref 0–5)
Hemoglobin: 13 g/dL (ref 12.0–15.0)
Lymphocytes Relative: 14 % (ref 12–46)
Lymphs Abs: 1.2 10*3/uL (ref 0.7–4.0)
MCH: 32.6 pg (ref 26.0–34.0)
MCV: 97.7 fL (ref 78.0–100.0)
Monocytes Relative: 11 % (ref 3–12)
Neutrophils Relative %: 73 % (ref 43–77)
Platelets: 195 10*3/uL (ref 150–400)
RBC: 3.99 MIL/uL (ref 3.87–5.11)
WBC: 8.9 10*3/uL (ref 4.0–10.5)

## 2012-10-14 MED ORDER — SODIUM CHLORIDE 0.9 % IV SOLN
1000.0000 mL | INTRAVENOUS | Status: DC
  Administered 2012-10-14: 1000 mL via INTRAVENOUS

## 2012-10-14 NOTE — ED Notes (Signed)
Continue to wait for transport from [facility].  Facility rep states transporters are w/another patient at MD office and will be coming to get this patient as well.

## 2012-10-14 NOTE — ED Notes (Signed)
Report called to [facility].  Patient voices no complaints.  Awaiting EMS transport.

## 2012-10-14 NOTE — ED Provider Notes (Addendum)
CSN: [id number]     Arrival date & time 10/14/12  0727 History  This chart was scribed for Celene Kras, MD by Bennett Scrape, ED Scribe. This patient was seen in room APA04/APA04 and the patient's care was started at 7:41 AM.   Chief Complaint  Patient presents with  . Fall   Level 5 Caveat- Dementia   The history is provided by the EMS personnel. No language interpreter was used.    HPI Comments: [name] is a 79 y.o. female brought in by ambulance from [facility], who presents to the Emergency Department for a possible fall. Per EMS, pt was found on the floor of her room this morning. It is unknown how long she was on the floor. SNF reported that the pt was awake when she was found. When asked what happened, the pt has garbled, nonsensical speech. Due to her h/o dementia, she is unable to answer any further questions.  Past Medical History  Diagnosis Date  . Dementia   . Arthritis   . GE reflux   . Alzheimer's disease   . Diabetes mellitus   . Hypertension    History reviewed. No pertinent past surgical history. History reviewed. No pertinent family history. History  Substance Use Topics  . Smoking status: Not on file  . Smokeless tobacco: Not on file  . Alcohol Use: No   No OB history provided.  Review of Systems  Unable to perform ROS: Dementia    Allergies  Penicillins  Home Medications   Current Outpatient Rx  Name  Route  Sig  Dispense  Refill  . calcium-vitamin D (OSCAL WITH D) 500-200 MG-UNIT per tablet   Oral   Take 1 tablet by mouth 2 (two) times daily.         Marland Kitchen donepezil (ARICEPT) 10 MG tablet   Oral   Take 10 mg by mouth at bedtime.          . hydrochlorothiazide (MICROZIDE) 12.5 MG capsule   Oral   Take 12.5 mg by mouth daily.         Marland Kitchen lisinopril (PRINIVIL,ZESTRIL) 10 MG tablet   Oral   Take 10 mg by mouth daily.         . Memantine HCl ER (NAMENDA XR) 28 MG CP24   Oral   Take 1 capsule by mouth daily.         .  nitrofurantoin (MACRODANTIN) 50 MG capsule   Oral   Take 50 mg by mouth at bedtime.         Marland Kitchen acetaminophen (TYLENOL) 500 MG tablet   Oral   Take 1,000 mg by mouth every 4 (four) hours as needed. For fever or minor pain         . aluminum & magnesium hydroxide (MAALOX) 225-200 MG/5ML suspension   Oral   Take 30 mLs by mouth every 6 (six) hours as needed. For indigestion or heartburn         . anti-nausea (EMETROL) solution   Oral   Take 10 mLs by mouth every 30 (thirty) minutes as needed. For nausea  Maximum of 4 doses         . diphenhydrAMINE (BENADRYL) 25 MG tablet   Oral   Take 25 mg by mouth every 4 (four) hours as needed. For minor allergic reactions         . guaifenesin (ROBITUSSIN) 100 MG/5ML syrup   Oral   Take 200 mg by mouth 4 (  four) times daily as needed. For cough Maximum of 4 doses         . loperamide (IMODIUM) 2 MG capsule   Oral   Take 2-4 mg by mouth 4 (four) times daily as needed. For diarrhea   maximum of 4 doses         . sodium phosphate (FLEET) enema   Rectal   Place 1 enema rectally daily as needed. For constipation if not relieved by milk of magnesia  follow package directions          Triage Vitals: BP 131/46  Pulse 60  Temp(Src) 97.5 F (36.4 C) (Oral)  Resp 17  Ht 5\' 5"  (1.651 m)  Wt 170 lb (77.111 kg)  BMI 28.29 kg/m2  SpO2 98%  Physical Exam  Nursing note and vitals reviewed. Constitutional: She appears well-developed and well-nourished. No distress.  HENT:  Head: Normocephalic and atraumatic.  Right Ear: External ear normal.  Left Ear: External ear normal.  Eyes: Conjunctivae are normal. Right eye exhibits no discharge. Left eye exhibits no discharge. No scleral icterus.  Neck: Neck supple. No tracheal deviation present.  Cardiovascular: Normal rate, regular rhythm and intact distal pulses.   Pulmonary/Chest: Effort normal and breath sounds normal. No stridor. No respiratory distress. She has no wheezes. She has  no rales.  Abdominal: Soft. Bowel sounds are normal. She exhibits no distension. There is no tenderness. There is no rebound and no guarding.  Musculoskeletal: She exhibits no edema and no tenderness.  No C, T or L spine tenderness to palpation, full ROM to all four extremities without pain or discomfort   Neurological: She is alert. She has normal strength. No sensory deficit. Cranial nerve deficit:  no gross defecits noted. She exhibits normal muscle tone. She displays no seizure activity. Coordination normal. GCS eye subscore is 4. GCS verbal subscore is 4. GCS motor subscore is 6.  Follows commands, moves all extremities, good eye contact, answers some questions yes/no appropriately but also with garbled, nonsensical speech  Skin: Skin is warm and dry. No rash noted.  Psychiatric: She has a normal mood and affect.    ED Course  Procedures (including critical care time)  DIAGNOSTIC STUDIES: Oxygen Saturation is 98% on room air, normal by my interpretation.    COORDINATION OF CARE: 7:46 AM- Will order CT of head, CBC and BMP.   Labs Review Labs Reviewed  BASIC METABOLIC PANEL - Abnormal; Notable for the following:    Glucose, Bld 107 (*)    GFR calc non Af Amer 58 (*)    GFR calc Af Amer 67 (*)    All other components within normal limits  CBC WITH DIFFERENTIAL  PROTIME-INR   Imaging Review Ct Head Wo Contrast  10/14/2012   CLINICAL DATA:  Nursing Home patient found down. Alzheimer's. Hypertension.  EXAM: CT HEAD WITHOUT CONTRAST  TECHNIQUE: Contiguous axial images were obtained from the base of the skull through the vertex without intravenous contrast.  COMPARISON:  10/13/2011.  FINDINGS: No mass lesion, mass effect, midline shift, hydrocephalus, hemorrhage. No acute territorial cortical ischemia/infarct. Atrophy and chronic ischemic white matter disease is present.Stable asymmetric enlargement of the temporal horn of the left lateral ventricle. Unchanged right choroidal fissure  cyst. Opacification of a few left ethmoid air cells is chronic.  IMPRESSION: Atrophy and chronic ischemic white matter disease without acute intracranial abnormality.   Electronically Signed   By: Andreas Newport M.D.   On: 10/14/2012 08:38    EKG Interpretation  Ventricular Rate:  90 PR Interval:  208 QRS Duration: 90 QT Interval:  412 QTC Calculation: 504 R Axis:   -6 Text Interpretation:  Sinus rhythm with Premature supraventricular complexes Low voltage QRS Cannot rule out Inferior infarct , age undetermined Cannot rule out Anterior infarct (cited on or before 12-May-2011) Abnormal ECG When compared with ECG of 19-Nov-2011 09:25, Minimal criteria for Inferior infarct are now Present QT has lengthened            MDM   1. Fall, initial encounter   2. Dementia    Patient was found on the floor of the nursing home. She is in no distress. Her laboratory tests are reassuring. Head CT does not show any signs of hemorrhage or other significant injury. The patient appears to be at her baseline. She is stable for discharge back to her nursing facility.  I personally performed the services described in this documentation, which was scribed in my presence.  The recorded information has been reviewed and is accurate.   Celene Kras, MD 10/14/12 1610  Celene Kras, MD 10/14/12 [phone]

## 2012-10-14 NOTE — ED Notes (Signed)
Notified [facility] to pick up patient.

## 2012-10-14 NOTE — ED Notes (Signed)
L leg slightly shorter than R.  Says "Oww" when L hip palpated.  No pain response elicited on R hip.

## 2012-10-14 NOTE — ED Notes (Signed)
Resident of [facility] found on floor in room this A.M.  Unknown how long she had been in floor.  Patient was awake when found.  She is on the dementia unit.

## 2013-02-17 ENCOUNTER — Emergency Department (HOSPITAL_COMMUNITY)
Admission: EM | Admit: 2013-02-17 | Discharge: 2013-02-18 | Disposition: A | Discharge status: Home / Self Care | Payer: Medicare Other | Attending: Emergency Medicine | Admitting: Emergency Medicine

## 2013-02-17 ENCOUNTER — Encounter (HOSPITAL_COMMUNITY): Payer: Self-pay | Admitting: Emergency Medicine

## 2013-02-17 ENCOUNTER — Emergency Department (HOSPITAL_COMMUNITY): Payer: Medicare Other

## 2013-02-17 DIAGNOSIS — E119 Type 2 diabetes mellitus without complications: Secondary | ICD-10-CM | POA: Insufficient documentation

## 2013-02-17 DIAGNOSIS — Z880 Allergy status to penicillin: Secondary | ICD-10-CM | POA: Insufficient documentation

## 2013-02-17 DIAGNOSIS — S91009A Unspecified open wound, unspecified ankle, initial encounter: Principal | ICD-10-CM

## 2013-02-17 DIAGNOSIS — I10 Essential (primary) hypertension: Secondary | ICD-10-CM | POA: Insufficient documentation

## 2013-02-17 DIAGNOSIS — K219 Gastro-esophageal reflux disease without esophagitis: Secondary | ICD-10-CM | POA: Insufficient documentation

## 2013-02-17 DIAGNOSIS — X58XXXA Exposure to other specified factors, initial encounter: Secondary | ICD-10-CM | POA: Insufficient documentation

## 2013-02-17 DIAGNOSIS — IMO0002 Reserved for concepts with insufficient information to code with codable children: Secondary | ICD-10-CM

## 2013-02-17 DIAGNOSIS — Z23 Encounter for immunization: Secondary | ICD-10-CM | POA: Insufficient documentation

## 2013-02-17 DIAGNOSIS — Y939 Activity, unspecified: Secondary | ICD-10-CM | POA: Insufficient documentation

## 2013-02-17 DIAGNOSIS — S81009A Unspecified open wound, unspecified knee, initial encounter: Secondary | ICD-10-CM | POA: Insufficient documentation

## 2013-02-17 DIAGNOSIS — S81809A Unspecified open wound, unspecified lower leg, initial encounter: Principal | ICD-10-CM

## 2013-02-17 DIAGNOSIS — M129 Arthropathy, unspecified: Secondary | ICD-10-CM | POA: Insufficient documentation

## 2013-02-17 DIAGNOSIS — Z79899 Other long term (current) drug therapy: Secondary | ICD-10-CM | POA: Insufficient documentation

## 2013-02-17 DIAGNOSIS — F0280 Dementia in other diseases classified elsewhere without behavioral disturbance: Secondary | ICD-10-CM | POA: Insufficient documentation

## 2013-02-17 DIAGNOSIS — Y9210 Unspecified residential institution as the place of occurrence of the external cause: Secondary | ICD-10-CM | POA: Insufficient documentation

## 2013-02-17 DIAGNOSIS — Z9181 History of falling: Secondary | ICD-10-CM | POA: Insufficient documentation

## 2013-02-17 DIAGNOSIS — G309 Alzheimer's disease, unspecified: Secondary | ICD-10-CM | POA: Insufficient documentation

## 2013-02-17 MED ORDER — TETANUS-DIPHTH-ACELL PERTUSSIS 5-2.5-18.5 LF-MCG/0.5 IM SUSP
0.5000 mL | Freq: Once | INTRAMUSCULAR | Status: AC
  Administered 2013-02-17: 0.5 mL via INTRAMUSCULAR
  Filled 2013-02-17: qty 0.5

## 2013-02-17 MED ORDER — BACITRACIN ZINC 500 UNIT/GM EX OINT
TOPICAL_OINTMENT | CUTANEOUS | Status: DC
Start: 2013-02-17 — End: 2013-02-18
  Filled 2013-02-17: qty 2.7

## 2013-02-17 NOTE — ED Provider Notes (Signed)
CSN: [id number]     Arrival date & time 02/17/13  2054 History   First MD Initiated Contact with Patient 02/17/13 2056     Chief Complaint  Patient presents with  . Skin tear      (Consider location/radiation/quality/duration/timing/severity/associated sxs/prior Treatment) HPI Comments: Patient presents for evaluation of a skin tear on her right lower leg. Patient comes to the ER by ambulance from a skilled nursing facility. Nursing staff at the facility report that they do not know how the wound occurred. Patient was found in her room with a wound on her leg. There were no known falls or other injuries today. Patient does have a history of falls, however.  Arrival, patient is awake, alert and confused. This is her baseline. Level V caveat: dementia.   Past Medical History  Diagnosis Date  . Dementia   . Arthritis   . GE reflux   . Alzheimer's disease   . Diabetes mellitus   . Hypertension    History reviewed. No pertinent past surgical history. No family history on file. History  Substance Use Topics  . Smoking status: Never Smoker   . Smokeless tobacco: Not on file  . Alcohol Use: No   OB History   Grav Para Term Preterm Abortions TAB SAB Ect Mult Living                 Review of Systems  Unable to perform ROS: Dementia      Allergies  Penicillins  Home Medications   Current Outpatient Rx  Name  Route  Sig  Dispense  Refill  . calcium-vitamin D (OSCAL WITH D) 500-200 MG-UNIT per tablet   Oral   Take 1 tablet by mouth 2 (two) times daily.         Marland Kitchen. donepezil (ARICEPT) 10 MG tablet   Oral   Take 10 mg by mouth at bedtime.          . hydrochlorothiazide (MICROZIDE) 12.5 MG capsule   Oral   Take 12.5 mg by mouth daily.         Marland Kitchen. lisinopril (PRINIVIL,ZESTRIL) 10 MG tablet   Oral   Take 10 mg by mouth daily.         . Memantine HCl ER (NAMENDA XR) 28 MG CP24   Oral   Take 1 capsule by mouth daily.         . nitrofurantoin (MACRODANTIN) 50 MG  capsule   Oral   Take 50 mg by mouth at bedtime.         Marland Kitchen. acetaminophen (TYLENOL) 500 MG tablet   Oral   Take 1,000 mg by mouth every 4 (four) hours as needed. For fever or minor pain         . aluminum & magnesium hydroxide (MAALOX) 225-200 MG/5ML suspension   Oral   Take 30 mLs by mouth every 6 (six) hours as needed. For indigestion or heartburn         . anti-nausea (EMETROL) solution   Oral   Take 10 mLs by mouth every 30 (thirty) minutes as needed. For nausea  Maximum of 4 doses         . diphenhydrAMINE (BENADRYL) 25 MG tablet   Oral   Take 25 mg by mouth every 4 (four) hours as needed. For minor allergic reactions         . guaifenesin (ROBITUSSIN) 100 MG/5ML syrup   Oral   Take 200 mg by mouth 4 (four) times daily  as needed. For cough Maximum of 4 doses         . loperamide (IMODIUM) 2 MG capsule   Oral   Take 2-4 mg by mouth 4 (four) times daily as needed. For diarrhea   maximum of 4 doses         . sodium phosphate (FLEET) enema   Rectal   Place 1 enema rectally daily as needed. For constipation if not relieved by milk of magnesia  follow package directions          BP 141/71  Pulse 88  Temp(Src) 98 F (36.7 C) (Oral)  SpO2 100% Physical Exam  Constitutional: She is oriented to person, place, and time. She appears well-developed and well-nourished. No distress.  HENT:  Head: Normocephalic and atraumatic.  Right Ear: Hearing normal.  Left Ear: Hearing normal.  Nose: Nose normal.  Mouth/Throat: Oropharynx is clear and moist and mucous membranes are normal.  Eyes: Conjunctivae and EOM are normal. Pupils are equal, round, and reactive to light.  Neck: Normal range of motion. Neck supple.  Cardiovascular: Regular rhythm, S1 normal and S2 normal.  Exam reveals no gallop and no friction rub.   No murmur heard. Pulmonary/Chest: Effort normal and breath sounds normal. No respiratory distress. She exhibits no tenderness.  Abdominal: Soft. Normal  appearance and bowel sounds are normal. There is no hepatosplenomegaly. There is no tenderness. There is no rebound, no guarding, no tenderness at McBurney's point and negative Murphy's sign. No hernia.  Musculoskeletal: Normal range of motion.  Neurological: She is alert and oriented to person, place, and time. She has normal strength. No cranial nerve deficit or sensory deficit. Coordination normal. GCS eye subscore is 4. GCS verbal subscore is 5. GCS motor subscore is 6.  Skin: Skin is warm, dry and intact. No rash noted. No cyanosis.     Psychiatric: She has a normal mood and affect. Her speech is normal and behavior is normal. Thought content normal.    ED Course  Procedures (including critical care time) Labs Review Labs Reviewed - No data to display Imaging Review No results found.  EKG Interpretation   None       MDM   Diagnosis: Skin Tear  Patient presents to ER with wound on her leg. She comes from a skilled nursing facility, etiology of the wound was unclear. She has had multiple falls in the past. CT head, x-ray of pelvis and x-ray of area of bone were therefore obtained. No acute injuries were seen. The records did not reveal any recent tetanus vaccination, was administered. Is a skin tear, no sutures are possible. Local wound care provided by nursing staff including cleaning and hypertension and dressing. Continue local wound care at the nursing facility.    Gilda Crease, MD 02/17/13 2203

## 2013-02-17 NOTE — Discharge Instructions (Signed)
Skin Tear Care  A skin tear is a wound in which the top layer of skin has peeled off. This is a common problem with aging because the skin becomes thinner and more fragile as a person gets older. In addition, some medicines, such as oral corticosteroids, can lead to skin thinning if taken for long periods of time.   A skin tear is often repaired with tape or skin adhesive strips. This keeps the skin that has been peeled off in contact with the healthier skin beneath. Depending on the location of the wound, a bandage (dressing) may be applied over the tape or skin adhesive strips. Sometimes, during the healing process, the skin turns black and dies. Even when this happens, the torn skin acts as a good dressing until the skin underneath gets healthier and repairs itself.  HOME CARE INSTRUCTIONS   · Change dressings once per day or as directed by your caregiver.  · Gently clean the skin tear and the area around the tear using saline solution or mild soap and water.  · Do not rub the injured skin dry. Let the area air dry.  · Apply petroleum jelly or an antibiotic cream or ointment to keep the tear moist. This will help the wound heal. Do not allow a scab to form.  · If the dressing sticks before the next dressing change, moisten it with warm soapy water and gently remove it.  · Protect the injured skin until it has healed.  · Only take over-the-counter or prescription medicines as directed by your caregiver.  · Take showers or baths using warm soapy water. Apply a new dressing after the shower or bath.  · Keep all follow-up appointments as directed by your caregiver.    SEEK IMMEDIATE MEDICAL CARE IF:   · You have redness, swelling, or increasing pain in the skin tear.  · You have pus coming from the skin tear.  · You have chills.  · You have a red streak that goes away from the skin tear.  · You have a bad smell coming from the tear or dressing.  · You have a fever or persistent symptoms for more than 2 3 days.  · You  have a fever and your symptoms suddenly get worse.  MAKE SURE YOU:  · Understand these instructions.  · Will watch this condition.  · Will get help right away if your child is not doing well or gets worse.  Document Released: 09/16/2000 Document Revised: 09/16/2011 Document Reviewed: 07/06/2011  Exit[employer]® Patient Information ©2014 [employer].

## 2013-02-17 NOTE — ED Notes (Signed)
[facility] where pt sustained a large skin tear to her right lower leg.  It is apparently unknown how this occurred and pt  is not thought to have fallen.

## 2013-02-17 NOTE — ED Notes (Signed)
taled with Misty StanleyLisa, who [facility] Gave her update on patient and encouraged f/u with PMD and daily wound care

## 2013-02-18 NOTE — ED Notes (Signed)
Pt sleeping soundly, waiting for [facility]

## 2013-05-19 ENCOUNTER — Emergency Department (HOSPITAL_COMMUNITY)
Admission: EM | Admit: 2013-05-19 | Discharge: 2013-05-19 | Disposition: A | Discharge status: Skilled Nursing Facility | Payer: Medicare Other | Attending: Emergency Medicine | Admitting: Emergency Medicine

## 2013-05-19 ENCOUNTER — Encounter (HOSPITAL_COMMUNITY): Payer: Self-pay | Admitting: Emergency Medicine

## 2013-05-19 DIAGNOSIS — E119 Type 2 diabetes mellitus without complications: Secondary | ICD-10-CM | POA: Insufficient documentation

## 2013-05-19 DIAGNOSIS — W19XXXA Unspecified fall, initial encounter: Secondary | ICD-10-CM

## 2013-05-19 DIAGNOSIS — Y939 Activity, unspecified: Secondary | ICD-10-CM | POA: Insufficient documentation

## 2013-05-19 DIAGNOSIS — G309 Alzheimer's disease, unspecified: Secondary | ICD-10-CM | POA: Insufficient documentation

## 2013-05-19 DIAGNOSIS — R296 Repeated falls: Secondary | ICD-10-CM | POA: Insufficient documentation

## 2013-05-19 DIAGNOSIS — I10 Essential (primary) hypertension: Secondary | ICD-10-CM | POA: Insufficient documentation

## 2013-05-19 DIAGNOSIS — S0003XA Contusion of scalp, initial encounter: Secondary | ICD-10-CM | POA: Insufficient documentation

## 2013-05-19 DIAGNOSIS — K219 Gastro-esophageal reflux disease without esophagitis: Secondary | ICD-10-CM | POA: Insufficient documentation

## 2013-05-19 DIAGNOSIS — Z880 Allergy status to penicillin: Secondary | ICD-10-CM | POA: Insufficient documentation

## 2013-05-19 DIAGNOSIS — Z79899 Other long term (current) drug therapy: Secondary | ICD-10-CM | POA: Insufficient documentation

## 2013-05-19 DIAGNOSIS — Y929 Unspecified place or not applicable: Secondary | ICD-10-CM | POA: Insufficient documentation

## 2013-05-19 DIAGNOSIS — S1093XA Contusion of unspecified part of neck, initial encounter: Principal | ICD-10-CM

## 2013-05-19 DIAGNOSIS — F0280 Dementia in other diseases classified elsewhere without behavioral disturbance: Secondary | ICD-10-CM | POA: Insufficient documentation

## 2013-05-19 DIAGNOSIS — S0083XA Contusion of other part of head, initial encounter: Principal | ICD-10-CM | POA: Insufficient documentation

## 2013-05-19 NOTE — ED Provider Notes (Signed)
CSN: [id number]     Arrival date & time 05/19/13  0612 History   First MD Initiated Contact with Patient 05/19/13 [phone]     Chief Complaint  Patient presents with  . Fall     Patient is a 80 y.o. female presenting with fall. The history is provided by the nursing home. The history is limited by the condition of the patient (history of dementia).  Fall This is a new problem. Episode onset: just prior to arrival. Nothing aggravates the symptoms. Nothing relieves the symptoms.  pt presents from nursing facility after fall Apparently she was found lying in hallway.  She was helped up and ambulating at her baseline.  It appeared she had a head injury due to small hematoma to her left forehead.  No other injuries reported. Pt is at her baseline per nursing staff  Pt has no complaints at this time  Past Medical History  Diagnosis Date  . Dementia   . Arthritis   . GE reflux   . Alzheimer's disease   . Diabetes mellitus   . Hypertension    History reviewed. No pertinent past surgical history. No family history on file. History  Substance Use Topics  . Smoking status: Never Smoker   . Smokeless tobacco: Not on file  . Alcohol Use: No   OB History   Grav Para Term Preterm Abortions TAB SAB Ect Mult Living                 Review of Systems  Unable to perform ROS: Dementia      Allergies  Penicillins  Home Medications   Prior to Admission medications   Medication Sig Start Date End Date Taking? Authorizing Provider  acetaminophen (TYLENOL) 500 MG tablet Take 1,000 mg by mouth every 4 (four) hours as needed. For fever or minor pain    Historical Provider, MD  aluminum & magnesium hydroxide (MAALOX) 225-200 MG/5ML suspension Take 30 mLs by mouth every 6 (six) hours as needed. For indigestion or heartburn    Historical Provider, MD  anti-nausea (EMETROL) solution Take 10 mLs by mouth every 30 (thirty) minutes as needed. For nausea  Maximum of 4 doses    Historical Provider, MD   calcium-vitamin D (OSCAL WITH D) 500-200 MG-UNIT per tablet Take 1 tablet by mouth 2 (two) times daily.    Historical Provider, MD  diphenhydrAMINE (BENADRYL) 25 MG tablet Take 25 mg by mouth every 4 (four) hours as needed. For minor allergic reactions    Historical Provider, MD  donepezil (ARICEPT) 10 MG tablet Take 10 mg by mouth at bedtime.     Historical Provider, MD  guaifenesin (ROBITUSSIN) 100 MG/5ML syrup Take 200 mg by mouth 4 (four) times daily as needed. For cough Maximum of 4 doses    Historical Provider, MD  hydrochlorothiazide (MICROZIDE) 12.5 MG capsule Take 12.5 mg by mouth daily.    Historical Provider, MD  lisinopril (PRINIVIL,ZESTRIL) 10 MG tablet Take 10 mg by mouth daily.    Historical Provider, MD  loperamide (IMODIUM) 2 MG capsule Take 2-4 mg by mouth 4 (four) times daily as needed. For diarrhea   maximum of 4 doses    Historical Provider, MD  Memantine HCl ER (NAMENDA XR) 28 MG CP24 Take 1 capsule by mouth daily.    Historical Provider, MD  nitrofurantoin (MACRODANTIN) 50 MG capsule Take 50 mg by mouth at bedtime.    Historical Provider, MD  sodium phosphate (FLEET) enema Place 1 enema rectally daily  as needed. For constipation if not relieved by milk of magnesia  follow package directions    Historical Provider, MD   BP 131/57  Pulse 60  Temp(Src) 97.8 F (36.6 C) (Axillary)  Resp 18  SpO2 100% Physical Exam CONSTITUTIONAL: elderly, frail HEAD: Normocephalic/atraumatic.  There are no hematomas or lacerations/abrasions noted EYES: EOMI ENMT: Mucous membranes moist, No evidence of facial/nasal trauma NECK: supple no meningeal signs SPINE:No bruising/crepitance/stepoffs noted to spine CV: S1/S2 noted LUNGS: Lungs are clear to auscultation bilaterally, no apparent distress Chest - nontender to palpation ABDOMEN: soft, nontender NEURO: Pt is awake/alert, moves all extremitiesx4 She is pleasantly confused Pt can ambulate with assistance EXTREMITIES: pulses  normal, full ROM.  All other extremities/joints ranged and nontender Pelvis is stable SKIN: warm, color normal   ED Course  Procedures   Pt well appearing It was reported she had signs of head injury I don't see any evidence of head injury on my evaluation She is ambulatory without any evidence of pain with ambulating No signs of extremity trauma Stable for d/c  MDM   Final diagnoses:  Fall    Nursing notes including past medical history and social history reviewed and considered in documentation     Joya Gaskinsonald W Mung Rinker, MD 05/19/13 [phone]

## 2013-05-19 NOTE — Discharge Instructions (Signed)
Fall Prevention and Home Safety Falls cause injuries and can affect all age groups. It is possible to prevent falls.  HOW TO PREVENT FALLS  Wear shoes with rubber soles that do not have an opening for your toes.  Keep the inside and outside of your house well lit.  Use night lights throughout your home.  Remove clutter from floors.  Clean up floor spills.  Remove throw rugs or fasten them to the floor with carpet tape.  Do not place electrical cords across pathways.  Put grab bars by your tub, shower, and toilet. Do not use towel bars as grab bars.  Put handrails on both sides of the stairway. Fix loose handrails.  Do not climb on stools or stepladders, if possible.  Do not wax your floors.  Repair uneven or unsafe sidewalks, walkways, or stairs.  Keep items you use a lot within reach.  Be aware of pets.  Keep emergency numbers next to the telephone.  Put smoke detectors in your home and near bedrooms. Ask your doctor what other things you can do to prevent falls. Document Released: 10/18/2008 Document Revised: 06/23/2011 Document Reviewed: 03/24/2011 [facility] Patient Information 2014 [employer], [place].     SEEK IMMEDIATE MEDICAL ATTENTION IF: There is confusion or drowsiness (although children frequently become drowsy after injury).  You cannot awaken the injured person.  There is nausea (feeling sick to your stomach) or continued, forceful vomiting.  You notice dizziness or unsteadiness which is getting worse, or inability to walk.  You have convulsions or unconsciousness.  You experience severe, persistent headaches not relieved by Tylenol. (Do not take aspirin as this impairs clotting abilities). Take other pain medications only as directed.  You cannot use arms or legs normally.  There are changes in pupil sizes. (This is the black center in the colored part of the eye)  There is clear or bloody discharge from the nose or ears.  Change in speech, vision,  swallowing, or understanding.  Localized weakness, numbness, tingling, or change in bowel or bladder control.

## 2013-05-19 NOTE — ED Notes (Signed)
[facility] Pt. From dementia unit, staff reported to EMS pt is at mental baseline. Staff helped pt. Up and she walked down hall. Small knot noted to left forehead.

## 2013-06-08 ENCOUNTER — Other Ambulatory Visit: Payer: Self-pay

## 2013-06-08 NOTE — Progress Notes (Signed)
Pt brought in a urine  On a [facility] hand req. For a urine culture dx 599.0

## 2013-08-05 DEATH — deceased

## 2015-10-01 IMAGING — CT CT HEAD W/O CM
1 series · 16 of 30 positions shown, 20 images · non-contrast
Comparison: Prior CT from 10/14/2012

CLINICAL DATA: Fall

EXAM:
CT HEAD WITHOUT CONTRAST
TECHNIQUE: Contiguous axial images were obtained from the base of the skull
through the vertex without intravenous contrast.

[Series 2: headtrauma 4.8 h37s · axial · 0.43mm/px · z∈[+271,+404]mm · 16 of 30 slices shown, 20 images]
[im 2/30  brain]
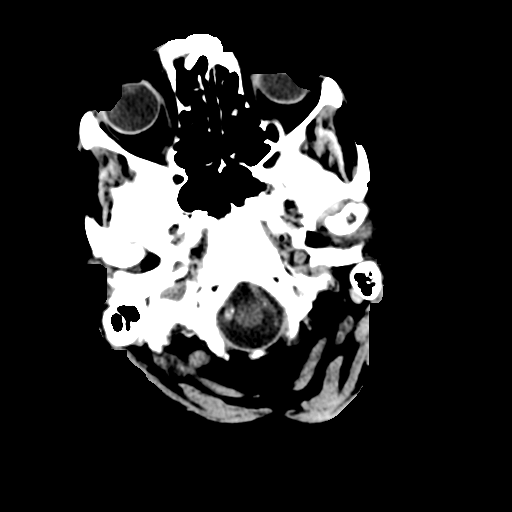
[im 2/30  bone]
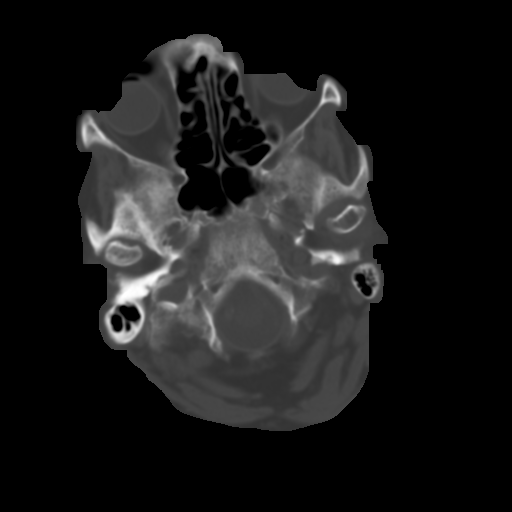
[im 4/30  brain]
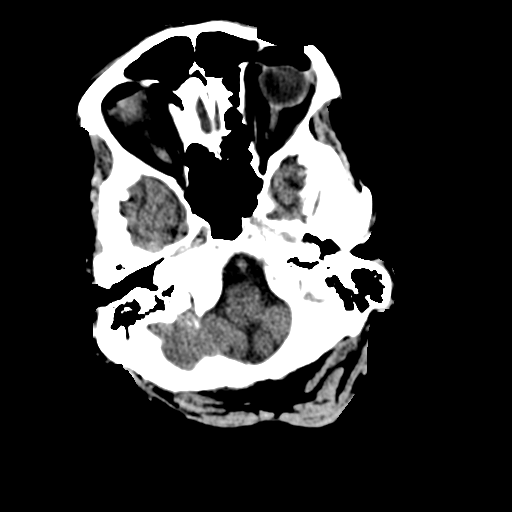
[im 6/30  brain]
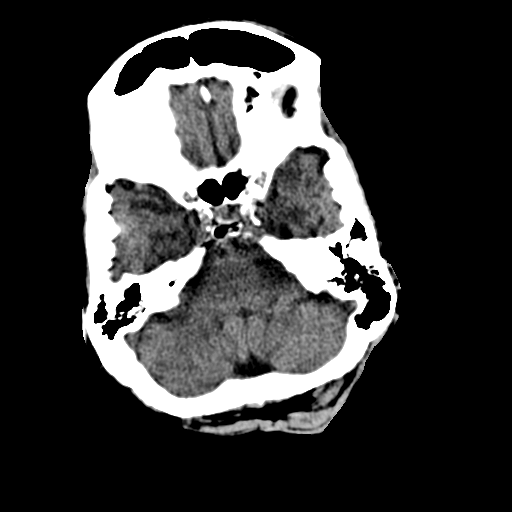
[im 8/30  brain]
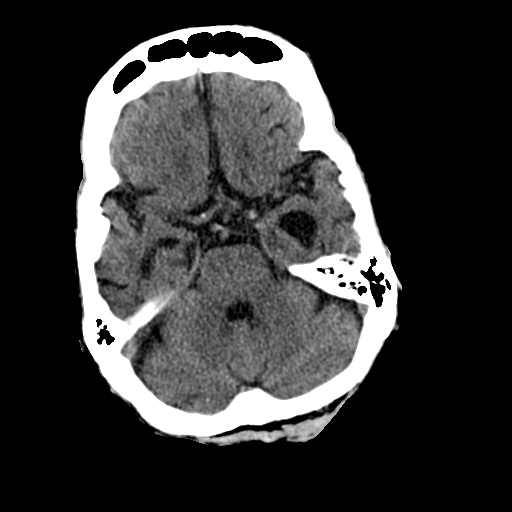
[im 9/30  brain]
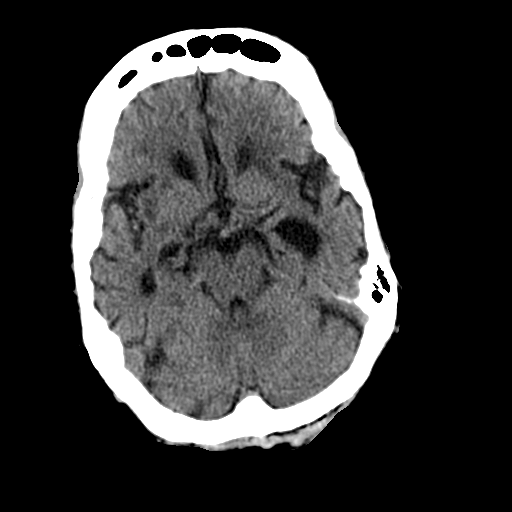
[im 9/30  bone]
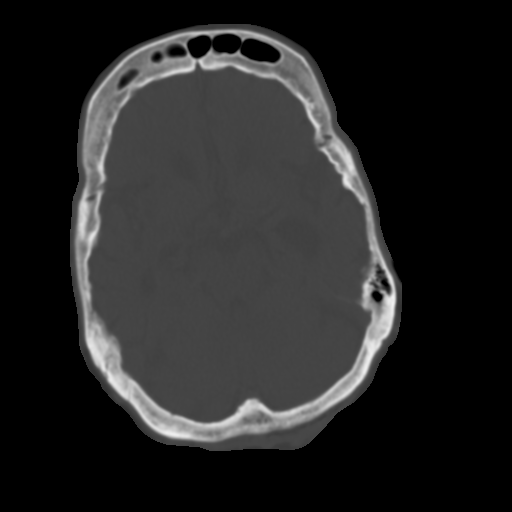
[im 11/30  brain]
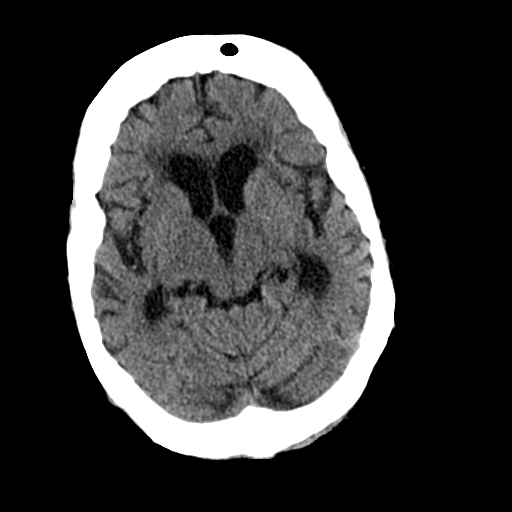
[im 13/30  brain]
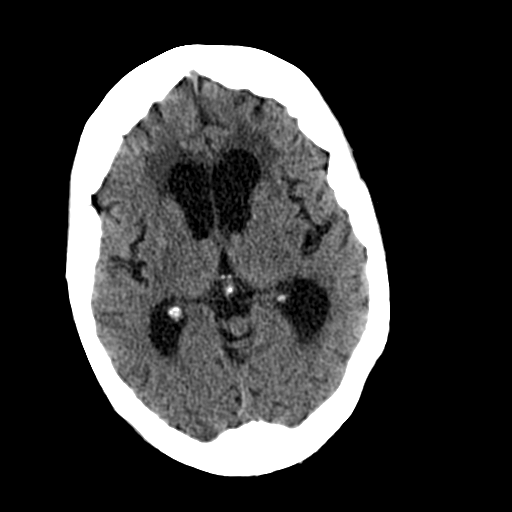
[im 15/30  brain]
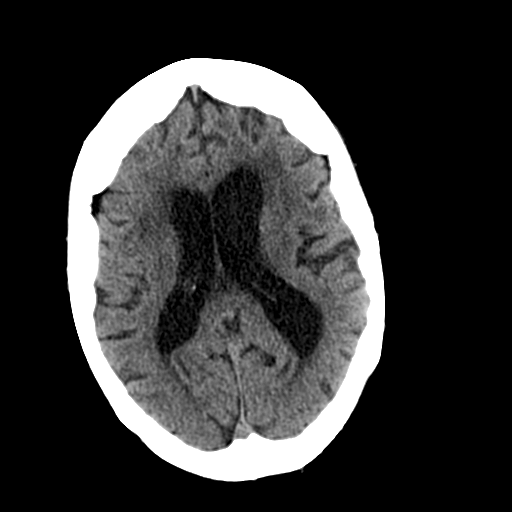
[im 16/30  brain]
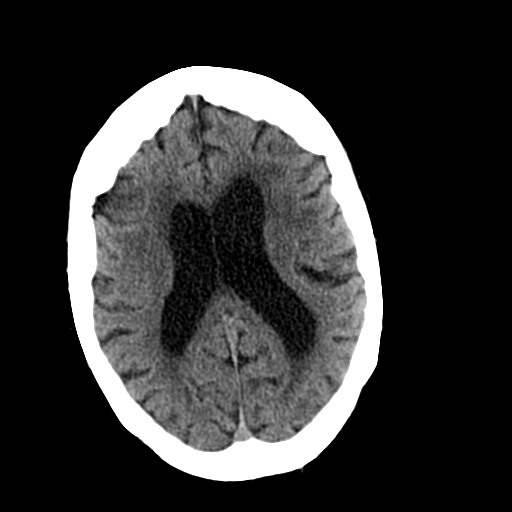
[im 16/30  bone]
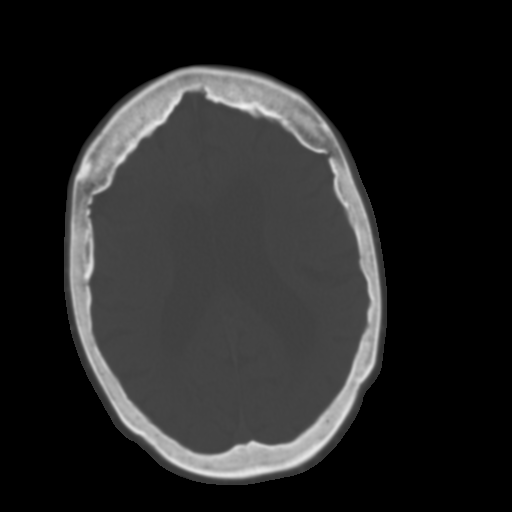
[im 18/30  brain]
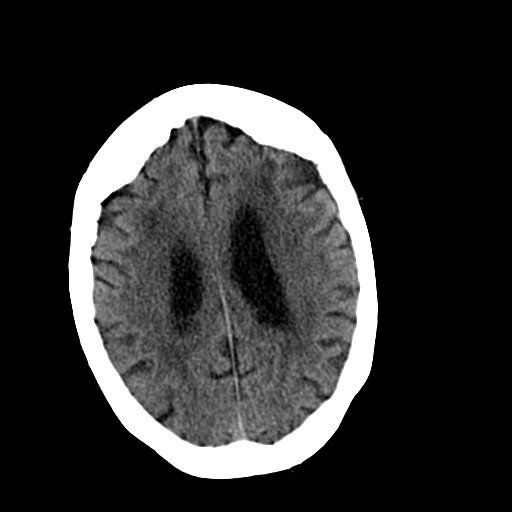
[im 20/30  brain]
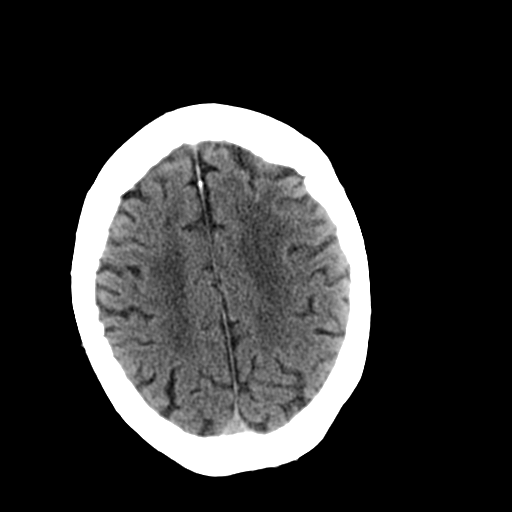
[im 22/30  brain]
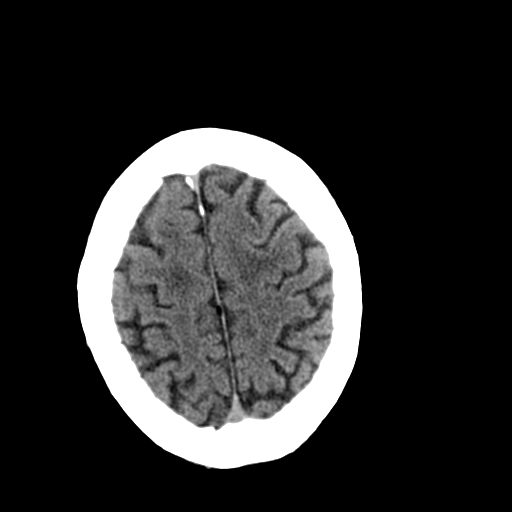
[im 23/30  brain]
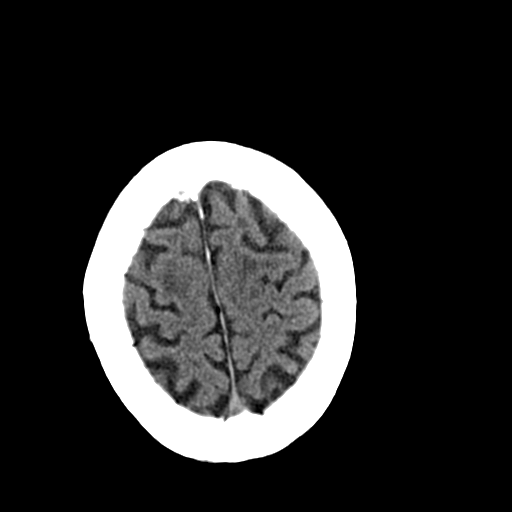
[im 23/30  bone]
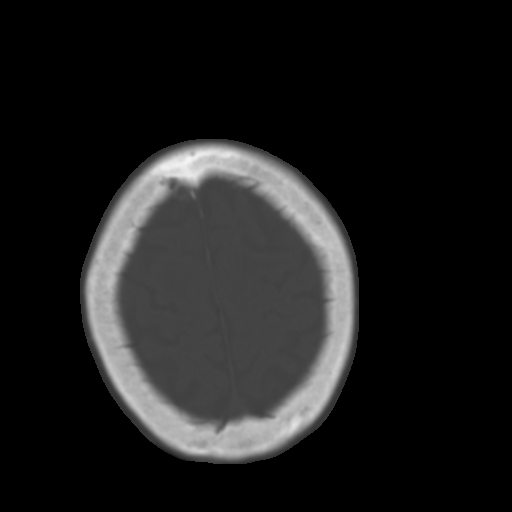
[im 25/30  brain]
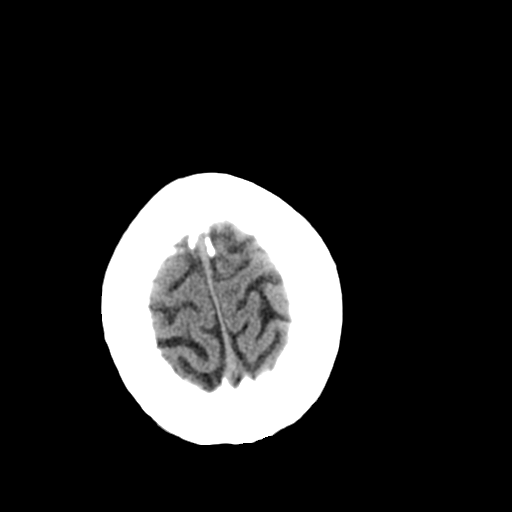
[im 27/30  brain]
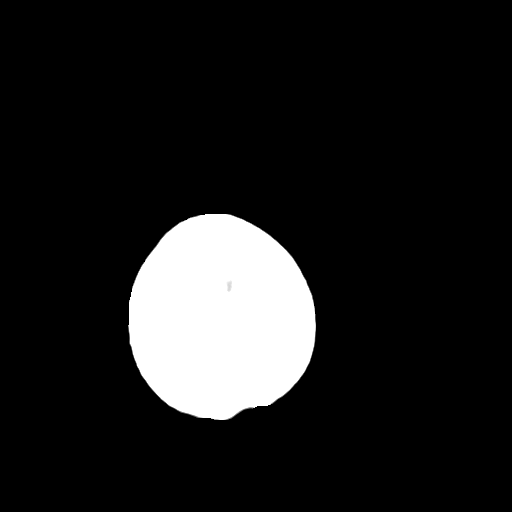
[im 29/30  brain]
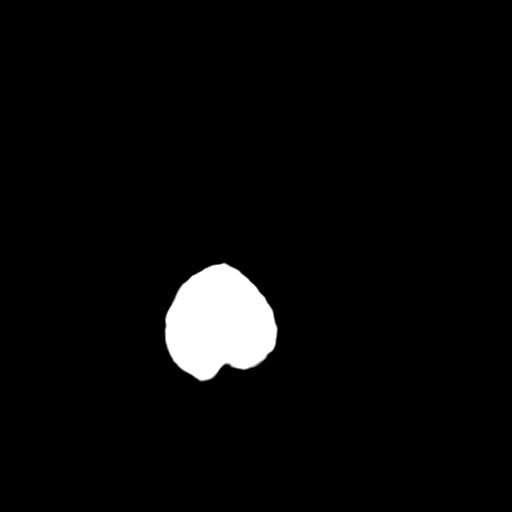

[16 of 30 positions shown; findings below may reference images not displayed]

FINDINGS: Advanced age-related atrophy with chronic microvascular ischemic
changes again noted, Stable.

There is no acute intracranial hemorrhage or infarct. No mass lesion
or midline shift. Gray-white matter differentiation is well
maintained. Ventricles are normal in size without evidence of
hydrocephalus. Right coronal fissure cyst again noted. Asymmetric
enlargement of the left temporal horn unchanged. No extra-axial
fluid collection.

The calvarium is intact.

Orbital soft tissues are within normal limits. Corneal implants
noted bilaterally.

The paranasal sinuses and mastoid air cells are well pneumatized and
free of fluid.

Scalp soft tissues are unremarkable.
IMPRESSION: 1. No acute intracranial abnormality.
2. Enhanced age-related atrophy with chronic microvascular ischemic
disease, unchanged.
